# Patient Record
Sex: Female | Born: 1990 | Race: Black or African American | Hispanic: No | Marital: Single | Smoking: Never smoker
Health system: Southern US, Community
[De-identification: ages and names within clinical notes are randomized; demographics above are authoritative.]

## PROBLEM LIST (undated history)

## (undated) ENCOUNTER — Emergency Department (HOSPITAL_COMMUNITY): Discharge status: Absent without leave | Payer: PRIVATE HEALTH INSURANCE

## (undated) DIAGNOSIS — J40 Bronchitis, not specified as acute or chronic: Secondary | ICD-10-CM

## (undated) DIAGNOSIS — E282 Polycystic ovarian syndrome: Secondary | ICD-10-CM

## (undated) HISTORY — DX: Polycystic ovarian syndrome: E28.2

## (undated) HISTORY — PX: WISDOM TOOTH EXTRACTION: SHX21

---
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
## 2008-08-19 ENCOUNTER — Telehealth (INDEPENDENT_AMBULATORY_CARE_PROVIDER_SITE_OTHER): Payer: Self-pay | Admitting: Family Medicine

## 2008-08-19 ENCOUNTER — Ambulatory Visit: Payer: Self-pay | Admitting: Family Medicine

## 2008-09-07 ENCOUNTER — Telehealth: Payer: Self-pay | Admitting: *Deleted

## 2009-01-20 ENCOUNTER — Ambulatory Visit: Payer: Self-pay | Admitting: Family Medicine

## 2009-07-06 ENCOUNTER — Ambulatory Visit: Payer: Self-pay | Admitting: Family Medicine

## 2009-07-06 ENCOUNTER — Encounter: Payer: Self-pay | Admitting: Family Medicine

## 2009-07-06 ENCOUNTER — Other Ambulatory Visit: Admission: RE | Admit: 2009-07-06 | Discharge: 2009-07-06 | Payer: Self-pay | Admitting: Family Medicine

## 2009-07-07 ENCOUNTER — Encounter: Payer: Self-pay | Admitting: Family Medicine

## 2009-07-13 ENCOUNTER — Encounter: Payer: Self-pay | Admitting: Family Medicine

## 2010-01-17 ENCOUNTER — Ambulatory Visit: Payer: Self-pay | Admitting: Family Medicine

## 2010-01-17 ENCOUNTER — Encounter: Payer: Self-pay | Admitting: Family Medicine

## 2010-01-17 DIAGNOSIS — E663 Overweight: Secondary | ICD-10-CM | POA: Insufficient documentation

## 2010-01-17 DIAGNOSIS — L2089 Other atopic dermatitis: Secondary | ICD-10-CM | POA: Insufficient documentation

## 2010-01-17 LAB — CONVERTED CEMR LAB
Glucose, Bld: 86 mg/dL (ref 70–99)
Prolactin: 8 ng/mL
TSH: 1.383 microintl units/mL (ref 0.350–4.500)
Testosterone: 34.25 ng/dL (ref 10–70)

## 2010-01-21 ENCOUNTER — Ambulatory Visit (HOSPITAL_COMMUNITY): Admission: RE | Admit: 2010-01-21 | Discharge: 2010-01-21 | Payer: Self-pay | Admitting: Family Medicine

## 2010-01-24 ENCOUNTER — Ambulatory Visit: Payer: Self-pay | Admitting: Family Medicine

## 2010-03-01 ENCOUNTER — Ambulatory Visit: Payer: Self-pay | Admitting: Family Medicine

## 2010-03-01 DIAGNOSIS — F609 Personality disorder, unspecified: Secondary | ICD-10-CM | POA: Insufficient documentation

## 2010-06-17 ENCOUNTER — Encounter: Payer: Self-pay | Admitting: Family Medicine

## 2010-07-27 ENCOUNTER — Ambulatory Visit: Payer: Self-pay

## 2010-08-03 ENCOUNTER — Emergency Department (HOSPITAL_COMMUNITY)
Admission: EM | Admit: 2010-08-03 | Discharge: 2010-08-03 | Payer: Self-pay | Source: Home / Self Care | Admitting: Emergency Medicine

## 2010-08-19 ENCOUNTER — Ambulatory Visit: Admit: 2010-08-19 | Payer: Self-pay

## 2010-09-13 NOTE — Miscellaneous (Signed)
  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

## 2010-09-13 NOTE — Assessment & Plan Note (Signed)
Summary: f/up,tcb   Vital Signs:  Patient profile:   20 year old female Height:      66 inches Weight:      159.7 pounds BMI:     25.87 Temp:     98.4 degrees F oral Pulse rate:   74 / minute BP sitting:   117 / 75  (left arm) Cuff size:   regular  Vitals Entered By: Garen Grams LPN (January 24, 2010 3:16 PM) CC: f/u weight, nutrition Is Patient Diabetic? No Pain Assessment Patient in pain? no        CC:  f/u weight and nutrition.  History of Present Illness: 1. Nutrition visit:    Food log highlights: - no meals before 3:30 pm -heavy carbs -light on proteins -no good fats -long time between meals  Counseling:  Spent 20 minutes going over food log and educating patient -explaining the importance of balanced macronutrients -how her diet is affecting her hormones -Gave her a list of good weight loss foods  Habits & Providers  Alcohol-Tobacco-Diet     Tobacco Status: never  Current Medications (verified): 1)  Sprintec 28 0.25-35 Mg-Mcg Tabs (Norgestimate-Eth Estradiol) .... One Daily 2)  Triamcinolone Acetonide 0.1 % Oint (Triamcinolone Acetonide) .... Apply Small Amount To Dry Skin As Needed Dispo: Qs For 1 Month  Allergies: No Known Drug Allergies  Past History:  Past Medical History: Hypertrichosis - Seasonal allergies Seizure disorder - 20 y/o Was on Depakote x 1 1/2 y Acne Hirsutism  Social History: Reviewed history from 08/19/2008 and no changes required. High school senior; going to [employer].   Lives with great grandmother (guardian), plays tennis, track and field.  Has been sexually activewith fiancee (started at age 80).  Doesn't use condoms regularly.  No tobacco, no illicits, no ETOH.  Physical Exam  General:  Well-developed,well-nourished,in no acute distress; alert,appropriate and cooperative throughout examination Neck:  supple and no masses.   Lungs:  normal respiratory effort, normal breath sounds, no crackles, and no wheezes.   Heart:   normal rate, regular rhythm, and no murmur.   Skin:  Significant hirsutism Small keloids on piercing sites Psych:  good eye contact, not anxious appearing, and not depressed appearing.     Impression & Recommendations:  Problem # 1:  OVERWEIGHT (ICD-278.02) Assessment Unchanged  spent  ~ 30 minutes in counseling patient on appropriate dietary changes.  Will f/u in 1 month to recheck weight and food log and see what changes she was able to make.  Once diet is improved would consider looking at lower estrogen OCPs to see if that would help.  Orders: [facility]- Est Level  3 (16109)  Complete Medication List: 1)  Sprintec 28 0.25-35 Mg-mcg Tabs (Norgestimate-eth estradiol) .... One daily 2)  Triamcinolone Acetonide 0.1 % Oint (Triamcinolone acetonide) .... Apply small amount to dry skin as needed dispo: qs for 1 month  Patient Instructions: 1)  It was good to see you today 2)  I think that we can make a big difference in how you feel if we adjust your diet 3)  Here is a good list of foods to choose from 4)  Please schedule a follow up appointment with me in 1 month. 5)  Keep another 2-3 day food log before that visit

## 2010-09-13 NOTE — Assessment & Plan Note (Signed)
Summary: f/u,df   Vital Signs:  Patient profile:   20 year old female Height:      66 inches Weight:      157.2 pounds BMI:     25.46 Temp:     98.3 degrees F oral Pulse rate:   67 / minute BP sitting:   119 / 79  (left arm) Cuff size:   regular  Vitals Entered By: Garen Grams LPN (March 01, 2010 9:09 AM) CC: f/u weight, nutrition Is Patient Diabetic? No Pain Assessment Patient in pain? no        CC:  f/u weight and nutrition.  History of Present Illness: 1. weight loss: She is here to work on her weight loss.  She was given a list of healthy foods to follow at her last visit which she tried to follow but had a tough time doing it.  She is having problems with cravings.  She stuck to the diet up until July 3rd and then hasn't been doing well since then.  Hasn't gained any weight since last visit and has actually lost a couple of pounds.    Food log: improved.  Is now eating breakfast and has decreased the amount of carbs.  Still needs to cut out more carbs.  2. Ear piercing infection: - Right ear upper lobe piercing - Has had it for months - It is painful to push on - She hasn't removed the piercing at all  ROS: denies ear redness, warmth, or fevers  3. Thought and Personality disorder - Thinks that she may have some similar traits as her mom who was diagnosed with bipolar and sociopath. 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 One Daily 2)  Triamcinolone Acetonide 0.1 % Oint (Triamcinolone Acetonide) .... Apply Small Amount To Dry Skin As Needed Dispo: Qs For 1  Month  Allergies: No Known Drug Allergies  Past History:  Past Medical History: Reviewed history from 01/24/2010 and no changes required. Hypertrichosis - Seasonal allergies Seizure disorder - 20 y/o Was on Depakote x 1 1/2 y Acne Hirsutism  Social History: Reviewed history from 08/19/2008 and no changes required. High school senior; going to [employer].   Lives with great grandmother (guardian), plays tennis, track and field.  Has been sexually activewith fiancee (started at age 24).  Doesn't use condoms regularly.  No tobacco, no illicits, no ETOH.  Physical Exam  General:  Well-developed,well-nourished,in no acute distress; alert,appropriate and cooperative throughout examination Ears:  Right ear:  TTP around piercing in upper lobe.  + pus expression.  Not warm or red.  + keloids Neck:  no masses Lungs:  normal respiratory effort.   Heart:  normal rate and regular rhythm.   Abdomen:  soft, non-tender, and normal bowel sounds.   Extremities:  no lower extremity edema Neurologic:  alert & oriented X3, cranial nerves II-XII intact, and strength normal in all extremities.   Psych:  Oriented X3, normally interactive, good eye contact, not anxious appearing, not depressed appearing, and not agitated.     Impression & Recommendations:  Problem # 1:  DIETARY SURVEILLANCE AND COUNSELING (ICD-V65.3)  Assessment New  Spent 20 minutes discussing with patient diet and nutrition advice.  Encouraged her to continue to limit the amount of carbs.  Eat every 3-4 hours.  Orders: [facility]- Est  Level 4 (78295)  Problem # 2:  INFECTION, SKIN AND SOFT TISSUE (ICD-686.9) Assessment: New  Infected ear piercing.  Advised pt to remove ear piercing.  She didn't want to do this.  Will try Antibiotic oinment on site 4 times / day for the next week.  If not better by then would strongly encourage her to remove piercing.  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  She has trouble relating to people and being in social situations.  Offered mental health counseling which she refused.  Orders: [facility]- Est  Level 4 (99214)  Complete Medication List: 1)  Sprintec 28 0.25-35 Mg-mcg Tabs (Norgestimate-eth estradiol) .... One daily 2)  Triamcinolone Acetonide 0.1 % Oint (Triamcinolone acetonide) .... Apply small amount to dry skin as needed dispo: qs for 1 month  Patient Instructions: 1)  It was good to see you today 2)  I'm glad that you have lost some weight and I think that your skin looks clearer 3)  Try and stick with the list of foods for all but one meal a week. 4)  Once a week you can have a cheat meal where you eat whatever you want. 5)  Put neosporin on the ear piercing 3-4 times / day 6)  If it is not better in a couple of weeks, you will need to take the piercing out 7)  For your thought disorder, we have good people that you can talk to, just let me know when you feel like you need to  8)  Please schedule a follow up appointment with me in 3 months

## 2010-09-13 NOTE — Assessment & Plan Note (Signed)
Summary: Multiple issues   Vital Signs:  Patient profile:   20 year old female Height:      66 inches Weight:      160 pounds BMI:     25.92 BSA:     1.82 Temp:     97.9 degrees F Pulse rate:   89 / minute BP sitting:   121 / 79  Vitals Entered By: Jone Baseman CMA (January 17, 2010 2:44 PM) CC: skin issues, joint pain, overweight Is Patient Diabetic? No Pain Assessment Patient in pain? no        CC:  skin issues, joint pain, and overweight.  History of Present Illness: 1. Dry skin:  has had a couple dry patches on her skin.  Uses moisturizers and it gets better  2. Keloids:  Has a new piercing in her right ear.  It is forming small keloids at the piercing sites.  She normally doesn't form keloids.  She doesn't like the way that they look.  3. Hirsutism: Has had excessive facial hair ever since puberty.  She thinks that it is related to a medicine that she took when she was younger.  Has never been checked for PCOS  4. Joint pain: Her left shoulder and left hip sometimes bother her.  She thinks that it is from lifting weight or from sleeping on them funny.  5. Overweight:  Would like to lose some weight.  Habits & Providers  Alcohol-Tobacco-Diet     Tobacco Status: never  Current Medications (verified): 1)  Sprintec 28 0.25-35 Mg-Mcg Tabs (Norgestimate-Eth Estradiol) .... One Daily  Allergies: No Known Drug Allergies  Past History:  Past Medical History: Reviewed history from 01/11/2007 and no changes required. Hypertrichosis - Seasonal allergies Seizure disorder - 20 y/o Was on Depakote x 1 1/2 y Acne  Social History: Reviewed history from 08/19/2008 and no changes required. High school senior; going to [employer].   Lives with great grandmother (guardian), plays tennis, track and field.  Has been sexually activewith fiancee (started at age 22).  Doesn't use condoms regularly.  No tobacco, no illicits, no ETOH.  Review of Systems  The patient denies fever,  weight loss, chest pain, syncope, dyspnea on exertion, peripheral edema, headaches, abdominal pain, muscle weakness, and suspicious skin lesions.    Physical Exam  General:  Well-developed,well-nourished,in no acute distress; alert,appropriate and cooperative throughout examination Lungs:  normal respiratory effort, normal breath sounds, no crackles, and no wheezes.   Heart:  normal rate, regular rhythm, and no murmur.   Msk:  Left shoulder: normal ROM, slightly TTP across joint lines  Extremities:  no lower extremity edema Skin:  One small dry patch on her back. 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  If unable to get in, would do Kenolog injection Orders: Dermatology Referral (Derma) [facility]- Est  Level 4 (87564)  Problem # 2:  ECZEMA, ATOPIC (ICD-691.8) Assessment: New  Triamcinolone cream Orders: Dermatology Referral (Derma) Ultrasound (Ultrasound) [facility]- Est  Level 4 (33295)  Her updated medication list for this problem includes:    Triamcinolone Acetonide 0.1 % Oint (Triamcinolone acetonide) .Marland Kitchen... Apply small amount to dry skin as needed dispo: qs for 1 month  Problem # 3:  HIRSUTISM (ICD-704.1) Assessment: New Will work up for PCOS.  would benefit from dietary adjustments Orders: Dermatology Referral (Derma) TSH-[facility] [phone]) Testosterone-[facility] [phone]) Prolactin-[facility] [phone]) Glucose-[facility] [phone]) [facility]- Est  Level 4 (99214)  Problem # 4:  OVERWEIGHT (ICD-278.02) Assessment: New  Will spend next appointment designated on diet / exercise.  Told her to bring in a 2-3 day food diary.  Orders: [facility]- Est  Level 4 (99214)  Complete Medication List: 1)  Sprintec 28 0.25-35 Mg-mcg Tabs (Norgestimate-eth estradiol) .... One daily 2)  Triamcinolone Acetonide 0.1 % Oint (Triamcinolone acetonide) .... Apply small amount to dry skin as needed  dispo: qs for 1 month  Patient Instructions: 1)  It was nice to meet you today 2)  I have put in a referral for Dermatology for your keloids and hair growth 3)  If you are unable to get in and see them, we can treat you for those issues 4)  I have sent in a steroid cream to use if you have dry patches 5)  I am going to do some testing to check you for PCOS 6)  I think that you should start taking Glucosamine / Chondroitin and Fish Oil for your joints 7)  Please schedule an appointment that is convenient for you to discuss your weight loss goals.  When you do please bring with you a 2-3 day food diary. Prescriptions: TRIAMCINOLONE ACETONIDE 0.1 % OINT (TRIAMCINOLONE ACETONIDE) Apply small amount to dry skin as needed Dispo: QS for 1 month  #1 x 3   Entered and Authorized by:   Angelena Sole MD   Signed by:   Angelena Sole MD on 01/17/2010   Method used:   Electronically to        [facility] [phone]* (retail)       [address]       [place], [place]  [address]       Ph: [phone] or [phone]       Fax: [phone]   RxID:   [id number]

## 2010-11-08 ENCOUNTER — Ambulatory Visit (INDEPENDENT_AMBULATORY_CARE_PROVIDER_SITE_OTHER): Payer: Medicaid Other | Admitting: Family Medicine

## 2010-11-08 ENCOUNTER — Encounter: Payer: Self-pay | Admitting: Family Medicine

## 2010-11-08 VITALS — BP 101/68 | HR 69 | Temp 98.6°F | Ht 66.0 in | Wt 151.6 lb

## 2010-11-08 DIAGNOSIS — E663 Overweight: Secondary | ICD-10-CM

## 2010-11-08 DIAGNOSIS — J45990 Exercise induced bronchospasm: Secondary | ICD-10-CM | POA: Insufficient documentation

## 2010-11-08 MED ORDER — ALBUTEROL SULFATE HFA 108 (90 BASE) MCG/ACT IN AERS: 2.0000 | INHALATION_SPRAY | Freq: Four times a day (QID) | RESPIRATORY_TRACT | Status: DC | PRN

## 2010-11-08 NOTE — Assessment & Plan Note (Signed)
Almost at ideal weight.  Congratulated her on her recent weight loss success.  Her biggest struggle is dealing with the cravings.  Advised her to start incorporating a cheat meal into her plan.  F/U in 3 months

## 2010-11-08 NOTE — Assessment & Plan Note (Signed)
Pt describes wheezing after exercise.  Will do a trial of Albuterol and see if that helps.

## 2010-11-08 NOTE — Progress Notes (Signed)
  Subjective:    Patient ID: Wendy Parsons, female    DOB: [birth year], 20 y.o.   MRN: [id number]  HPI  Accompanied by her boyfriend Durene Cal) 1. Wheezing:  Pt presents today to discuss wheezing.  She has had this for years but it is worse in the spring with her allergies.  She only gets the shortness of breath and wheezing after she has been exercising.  She is also going to be doing a Taekwondo class next semester and would like something to keep her from being short of breath during that.  She doesn't think that it is only being out of shape.  She doesn't have wheezing at other times.  She has never tried anything for it.  Rest makes it better.  2. Overweight:  She is nearly to her ideal weight.  She has been sticking to the diet off and on for the past couple of months.  She was really good for about 1 month and lost a lot of weight.  Her goal weight is around 145.  Her boyfriend has also started on the plan and has lost about 10 lbs.  Her biggest struggle is dealing with the cravings.   Review of Systems Denies chest pain, abdominal pain, acid taste in her mouth, snoring, fevers, cough, pleuritic pain    Objective:   Physical Exam  Constitutional: She is oriented to person, place, and time. She appears well-nourished. No distress.  Eyes: Conjunctivae are normal.  Neck: Normal range of motion. Neck supple. No thyromegaly present.  Cardiovascular: Normal rate, regular rhythm and normal heart sounds.   No murmur heard. Pulmonary/Chest: Effort normal and breath sounds normal. No respiratory distress. She has no wheezes. She has no rales.  Abdominal: Soft. She exhibits no distension.  Musculoskeletal: She exhibits no edema.  Neurological: She is alert and oriented to person, place, and time.  Skin: Skin is warm and dry. No rash noted. She is not diaphoretic.  Psychiatric: She has a normal mood and affect.          Assessment & Plan:

## 2010-11-08 NOTE — Patient Instructions (Signed)
Congratulations on your weight loss, you are almost at your goal weight Try and incorporate a cheat meal once a week into your routine, this should help with the cravings We will try an inhaler for the wheezing and see if that helps Please schedule a follow up appointment in 3 months

## 2010-11-30 ENCOUNTER — Ambulatory Visit (INDEPENDENT_AMBULATORY_CARE_PROVIDER_SITE_OTHER): Payer: Medicaid Other | Admitting: Family Medicine

## 2010-11-30 ENCOUNTER — Encounter: Payer: Self-pay | Admitting: Family Medicine

## 2010-11-30 DIAGNOSIS — F439 Reaction to severe stress, unspecified: Secondary | ICD-10-CM | POA: Insufficient documentation

## 2010-11-30 DIAGNOSIS — G44009 Cluster headache syndrome, unspecified, not intractable: Secondary | ICD-10-CM | POA: Insufficient documentation

## 2010-11-30 DIAGNOSIS — F430 Acute stress reaction: Secondary | ICD-10-CM

## 2010-11-30 NOTE — Progress Notes (Signed)
  Subjective:    Patient ID: Wendy Parsons, female    DOB: [birth year], 20 y.o.   MRN: [id number]  HPI 1. Stress:  Pt is under a lot of stress for the past couple of weeks.  She has a heavy work load from school and is in the middle of finals.  She has been helping to relieve the stress by going out on the weekends and drinking 1-2 beers with friends.  She has not been doing any illegal or prescription drugs.    2. Headaches:  Since she has been under a lot of stress she has had headaches.  She has a hx of headaches but these are a little different.  She normally has bilateral, squeezing type headaches.  These headaches are located on the left side of her head above her left eye with some radiation down around her left ear.  They normally last for about 1 hour.  They go away on their own with rest and relaxation.  She hasn't been taking anything for the pain not even Tylenol / Ibuprofen.  She has had some photophobia but denies any phonophobia or n/v.  The headaches come and go.  She has been having them about every other day.  She has not been getting enough sleep and has not been eating very healthy.   Review of Systems Denies fever, chills, neck pain / stiffness, numbness / weakness, vision changes, hearing changes    Objective:   Physical Exam  Constitutional: She is oriented to person, place, and time. She appears well-nourished. No distress.  Eyes: Conjunctivae are normal.  Fundoscopic exam:      The right eye shows no arteriolar narrowing, no AV nicking and no papilledema.       The left eye shows no arteriolar narrowing, no AV nicking and no papilledema.  Neck: Normal range of motion. Neck supple. No thyromegaly present.  Cardiovascular: Normal rate, regular rhythm and normal heart sounds.   No murmur heard. Pulmonary/Chest: Effort normal and breath sounds normal. No respiratory distress. She has no wheezes. She has no rales.  Abdominal: Soft. She exhibits no distension.    Musculoskeletal: She exhibits no edema.  Neurological: She is alert and oriented to person, place, and time. She has normal strength. No cranial nerve deficit or sensory deficit. She exhibits normal muscle tone. She displays a negative Romberg sign. Gait normal.  Skin: Skin is warm and dry. No rash noted. She is not diaphoretic.  Psychiatric: She has a normal mood and affect.          Assessment & Plan:

## 2010-11-30 NOTE — Assessment & Plan Note (Signed)
Likely multifactorial from school, lack of sleep, poor diet.  Reviewed appropriate lifestyle changes that can help.  Will also recommend basic analgesic treatment with Tylenol / Ibuprofen.  If persists would consider something for ppx.

## 2010-11-30 NOTE — Assessment & Plan Note (Signed)
From responsibilities at school.  No indication for medical therapy.  Reviewed some basic stress management skills.

## 2011-08-13 ENCOUNTER — Encounter (HOSPITAL_COMMUNITY): Payer: Self-pay

## 2011-08-13 ENCOUNTER — Emergency Department (INDEPENDENT_AMBULATORY_CARE_PROVIDER_SITE_OTHER)
Admission: EM | Admit: 2011-08-13 | Discharge: 2011-08-13 | Disposition: A | Discharge status: Home / Self Care | Payer: Medicaid Other | Source: Home / Self Care | Attending: Emergency Medicine | Admitting: Emergency Medicine

## 2011-08-13 DIAGNOSIS — B9789 Other viral agents as the cause of diseases classified elsewhere: Secondary | ICD-10-CM

## 2011-08-13 DIAGNOSIS — J029 Acute pharyngitis, unspecified: Secondary | ICD-10-CM

## 2011-08-13 HISTORY — DX: Bronchitis, not specified as acute or chronic: J40

## 2011-08-13 MED ORDER — GUAIFENESIN ER 600 MG PO TB12: 600.0000 mg | ORAL_TABLET | Freq: Two times a day (BID) | ORAL | Status: AC

## 2011-08-13 MED ORDER — LIDOCAINE VISCOUS 2 % MT SOLN: 10.0000 mL | Freq: Three times a day (TID) | OROMUCOSAL | Status: AC | PRN

## 2011-08-13 MED ORDER — IBUPROFEN 600 MG PO TABS: 600.0000 mg | ORAL_TABLET | Freq: Four times a day (QID) | ORAL | Status: AC | PRN

## 2011-08-13 NOTE — ED Provider Notes (Signed)
History     CSN: [id number]  Arrival date & time 08/13/11  0908   First MD Initiated Contact with Patient 08/13/11 [phone]      Chief Complaint  Patient presents with  . Sore Throat    HPI Comments: Pt with ST, bodyaches, chills, swollen cervical LN, fatigue starting yesterday. Reports some chest and nasal congestion, raspy voice.  No documented fevers at home. No N/V, cough, wheeze, SOB, abd pain, rash, sensation of throat swelling shut, sick contacts. No Breathing difficulty, Drooling, Trismus. Took some mucinex last night x 1 w/o relief.      Patient is a 20 y.o. female presenting with pharyngitis. The history is provided by the patient.  Sore Throat This is a new problem. The current episode started yesterday. The problem occurs constantly. The problem has been gradually worsening. Pertinent negatives include no abdominal pain. The symptoms are aggravated by swallowing.    Past Medical History  Diagnosis Date  . Bronchitis     Past Surgical History  Procedure Date  . Wisdom tooth extraction     History reviewed. No pertinent family history.  History  Substance Use Topics  . Smoking status: Never Smoker   . Smokeless tobacco: Not on file  . Alcohol Use: Yes    OB History    Grav Para Term Preterm Abortions TAB SAB Ect Mult Living                  Review of Systems  Constitutional: Positive for fatigue. Negative for fever.  HENT: Positive for congestion, sore throat and trouble swallowing. Negative for ear pain, rhinorrhea, mouth sores, voice change and postnasal drip.   Respiratory: Negative for cough and wheezing.   Gastrointestinal: Negative for nausea, vomiting and abdominal pain.  Musculoskeletal: Positive for myalgias.  Skin: Negative for rash.  Hematological: Positive for adenopathy.  All other systems reviewed and are negative.    Allergies  Latex  Home Medications   Current Outpatient Rx  Name Route Sig Dispense Refill  . ALBUTEROL SULFATE  HFA 108 (90 BASE) MCG/ACT IN AERS Inhalation Inhale 2 puffs into the lungs every 6 (six) hours as needed for wheezing. 1 Inhaler 3  . GUAIFENESIN ER 600 MG PO TB12 Oral Take 1 tablet (600 mg total) by mouth 2 (two) times daily. 14 tablet 0  . IBUPROFEN 600 MG PO TABS Oral Take 1 tablet (600 mg total) by mouth every 6 (six) hours as needed for pain. 30 tablet 0  . LIDOCAINE VISCOUS 2 % MT SOLN Oral Take 10 mLs by mouth 3 (three) times daily as needed for pain. Swish and spit. Do not swallow. 100 mL 0  . NORGESTIMATE-ETH ESTRADIOL 0.25-35 MG-MCG PO TABS Oral Take 1 tablet by mouth daily.      . TRIAMCINOLONE ACETONIDE 0.1 % EX OINT  Apply small amount to dry skin as needed       BP 142/87  Pulse 69  Temp(Src) 98.1 F (36.7 C) (Oral)  Resp 14  SpO2 100%  LMP 08/10/2011  Physical Exam  Nursing note and vitals reviewed. Constitutional: She is oriented to person, place, and time. She appears well-developed and well-nourished. No distress.  HENT:  Head: Normocephalic and atraumatic. No trismus in the jaw.  Right Ear: Tympanic membrane normal.  Left Ear: Tympanic membrane normal.  Nose: Rhinorrhea present. No mucosal edema. Right sinus exhibits no maxillary sinus tenderness and no frontal sinus tenderness. Left sinus exhibits no maxillary sinus tenderness and no frontal sinus  tenderness.  Mouth/Throat: Uvula is midline and mucous membranes are normal. Posterior oropharyngeal erythema present. No oropharyngeal exudate or tonsillar abscesses.       Swollen red, tonsils with scant exudates  Eyes: Conjunctivae and EOM are normal. Pupils are equal, round, and reactive to light.  Neck: Normal range of motion.  Cardiovascular: Normal rate, regular rhythm and normal heart sounds.   Pulmonary/Chest: Effort normal and breath sounds normal. No stridor.  Abdominal: Soft. Bowel sounds are normal. She exhibits no distension and no mass. There is no tenderness. There is no rebound and no guarding.    Musculoskeletal: Normal range of motion.  Lymphadenopathy:    She has cervical adenopathy.  Neurological: She is alert and oriented to person, place, and time.  Skin: Skin is warm and dry.  Psychiatric: She has a normal mood and affect. Her behavior is normal. Judgment and thought content normal.    ED Course  Procedures (including critical care time)   Labs Reviewed  POCT RAPID STREP A ([facility]RG CARE ONLY)   No results found.   1. Pharyngitis with viral syndrome       MDM    Luiz Blare, MD 08/13/11 1023

## 2011-08-13 NOTE — ED Notes (Signed)
Pt dry throat and cold symptoms that started yesterday

## 2011-08-17 ENCOUNTER — Emergency Department (INDEPENDENT_AMBULATORY_CARE_PROVIDER_SITE_OTHER)
Admission: EM | Admit: 2011-08-17 | Discharge: 2011-08-17 | Disposition: A | Discharge status: Home / Self Care | Payer: PRIVATE HEALTH INSURANCE | Source: Home / Self Care | Attending: Family Medicine | Admitting: Family Medicine

## 2011-08-17 ENCOUNTER — Encounter (HOSPITAL_COMMUNITY): Payer: Self-pay

## 2011-08-17 DIAGNOSIS — J029 Acute pharyngitis, unspecified: Secondary | ICD-10-CM

## 2011-08-17 MED ORDER — ACETAMINOPHEN-CODEINE 120-12 MG/5ML PO SUSP: 5.0000 mL | Freq: Four times a day (QID) | ORAL | Status: AC | PRN

## 2011-08-17 MED ORDER — PREDNISONE 20 MG PO TABS: ORAL_TABLET | ORAL | Status: AC

## 2011-08-17 MED ORDER — CEFPODOXIME PROXETIL 100 MG PO TABS: 100.0000 mg | ORAL_TABLET | Freq: Two times a day (BID) | ORAL | Status: DC

## 2011-08-17 MED ORDER — CEFPODOXIME PROXETIL 100 MG PO TABS: 100.0000 mg | ORAL_TABLET | Freq: Two times a day (BID) | ORAL | Status: AC

## 2011-08-17 NOTE — ED Notes (Signed)
.    States she was seen here Sunday for sore throat and cold sx.  States sore throat is much worse since last night.  States she has mostly been in bed since Sunday.

## 2011-08-21 NOTE — ED Provider Notes (Signed)
History     CSN: [id number]  Arrival date & time 08/17/11  4098   First MD Initiated Contact with Patient 08/17/11 1821      Chief Complaint  Patient presents with  . Sore Throat    (Consider location/radiation/quality/duration/timing/severity/associated sxs/prior treatment) HPI Comments: 54 y/ female non smoker here c/o sore throat for 5 days worsening was seen here 3 days ago treated as viral infection but symptoms worsening. Reports subjective fever and chills, pain with swallowing and body aches. No difficulty breathing or chest pain. Rapid strep test negative x 2. Mono test negative. Close contacts with influenza like illness. No nausea, vomiting or diarrhea.   Past Medical History  Diagnosis Date  . Bronchitis     Past Surgical History  Procedure Date  . Wisdom tooth extraction     No family history on file.  History  Substance Use Topics  . Smoking status: Never Smoker   . Smokeless tobacco: Not on file  . Alcohol Use: Yes    OB History    Grav Para Term Preterm Abortions TAB SAB Ect Mult Living                  Review of Systems  Constitutional: Positive for fever and chills.  HENT: Positive for sore throat. Negative for drooling, neck stiffness and voice change.   Respiratory: Positive for cough. Negative for chest tightness, shortness of breath, wheezing and stridor.   Gastrointestinal: Negative for nausea, vomiting, abdominal pain and diarrhea.  Musculoskeletal: Positive for myalgias.  Skin: Negative for rash.  Neurological: Positive for headaches.    Allergies  Latex  Home Medications   Current Outpatient Rx  Name Route Sig Dispense Refill  . GUAIFENESIN ER 600 MG PO TB12 Oral Take 1 tablet (600 mg total) by mouth 2 (two) times daily. 14 tablet 0  . IBUPROFEN 600 MG PO TABS Oral Take 1 tablet (600 mg total) by mouth every 6 (six) hours as needed for pain. 30 tablet 0  . LIDOCAINE VISCOUS 2 % MT SOLN Oral Take 10 mLs by mouth 3 (three) times  daily as needed for pain. Swish and spit. Do not swallow. 100 mL 0  . NORGESTIMATE-ETH ESTRADIOL 0.25-35 MG-MCG PO TABS Oral Take 1 tablet by mouth daily.      . ACETAMINOPHEN-CODEINE 120-12 MG/5ML PO SUSP Oral Take 5 mLs by mouth every 6 (six) hours as needed for pain.  60 mL 0  . CEFPODOXIME PROXETIL 100 MG PO TABS Oral Take 1 tablet (100 mg total) by mouth 2 (two) times daily. 14 tablet 0  . PREDNISONE 20 MG PO TABS  2 tabs po daily for 5 days. 10 tablet no    BP 97/73  Pulse 89  Temp(Src) 98.4 F (36.9 C) (Oral)  Resp 18  SpO2 100%  LMP 08/10/2011  Physical Exam  Nursing note and vitals reviewed. Constitutional: She is oriented to person, place, and time. She appears well-developed and well-nourished. No distress.  HENT:  Head: Normocephalic and atraumatic.  Right Ear: External ear normal.  Left Ear: External ear normal.  Nose: Nose normal.       Pharyngeal and bilateral tonsil erythema and swelling, no exudates, uvula central with symmetric elevation of soft palate. No trismus. No peritonsillar swelling, no fluctuations.  Eyes: Conjunctivae and EOM are normal. Pupils are equal, round, and reactive to light.  Neck: Normal range of motion. Neck supple.       Moderate enlarged bilateral tender submandibular lymph nodes.  Cardiovascular: Normal rate, regular rhythm and normal heart sounds.   Pulmonary/Chest: Effort normal and breath sounds normal. No respiratory distress. She has no wheezes. She has no rales. She exhibits no tenderness.  Abdominal: Soft. There is no tenderness.       No hepato or splenomegally  Neurological: She is alert and oriented to person, place, and time.  Skin: No rash noted.    ED Course  Procedures (including critical care time)   Labs Reviewed  POCT RAPID STREP A ([facility] CARE ONLY)  POCT INFECTIOUS MONO SCREEN  LAB REPORT - SCANNED   No results found.   1. Pharyngitis       MDM  Associated tonsillitis. Non toxic. Negative rapid strep  test as well as mono and not enlarged splen but early in evolution. Decided to treat with vantin, prednisone and analgesics. Ask to avoid exertion and return if worsening symptoms.        Sharin Grave, MD 08/21/11 [phone]

## 2014-03-20 ENCOUNTER — Encounter: Payer: Self-pay | Admitting: *Deleted

## 2014-05-13 ENCOUNTER — Encounter: Payer: PRIVATE HEALTH INSURANCE | Admitting: Obstetrics & Gynecology

## 2014-05-15 ENCOUNTER — Ambulatory Visit (INDEPENDENT_AMBULATORY_CARE_PROVIDER_SITE_OTHER): Payer: Self-pay | Admitting: Obstetrics & Gynecology

## 2014-05-15 ENCOUNTER — Encounter: Payer: Self-pay | Admitting: Obstetrics & Gynecology

## 2014-05-15 VITALS — BP 118/75 | HR 55 | Ht 65.0 in | Wt 125.0 lb

## 2014-05-15 DIAGNOSIS — L680 Hirsutism: Secondary | ICD-10-CM

## 2014-05-15 LAB — HEMOGLOBIN A1C
HEMOGLOBIN A1C: 5.6 % (ref <5.7)
MEAN PLASMA GLUCOSE: 114 mg/dL (ref <117)

## 2014-05-15 LAB — TSH: TSH: 2.595 u[IU]/mL (ref 0.350–4.500)

## 2014-05-15 MED ORDER — NORETHIN ACE-ETH ESTRAD-FE 1-20 MG-MCG(24) PO TABS: 1.0000 | ORAL_TABLET | Freq: Every day | ORAL | Status: DC

## 2014-05-15 MED ORDER — SPIRONOLACTONE 100 MG PO TABS: 200.0000 mg | ORAL_TABLET | Freq: Once | ORAL | Status: DC

## 2014-05-15 NOTE — Patient Instructions (Addendum)
Hirsutism and Masculinization Hirsutism (increased body hair) is the growth of colored (pigmented) thick hair in women. It is most noticeable when it is on the moustache or beard areas. The other common sites are the:  Chest.  Abdomen.  Thighs.  Back. Pubic hair growth may run upward from the usual bikini line to the middle of the abdomen.  Virilism (masculinization) is more extensive than hirsutism. It has extra symptoms. There may be:  Acne.  Oily skin.  Baldness.  Enlargement of the clitoris.  Increased sex drive (libido).  Voice deepening.  Reduced breast size.  Irregular or absent periods.  Aggression. The scalp hair growth may also bald in a typical female pattern. CAUSES  This is caused by too much female sex hormone (androgen) in the body. It can be produced by the ovaries, adrenal glands, and within the skin. Hirsutism is most commonly related to polycystic ovarian syndrome (PCOS). The first signs of increased androgen levels are hirsutism and acne. How sensitive each person is to hormone levels varies greatly. Virilism may result from higher androgen levels. Some women with hirsutism have normal hormone levels. Eventually there may be female pattern balding. These problems are also connected to difficulty in having children (infertility). In addition, both malignant and benign tumors may cause hirsutism such as tumors of the adrenal gland (adenomas or adenocarcinomas) but this is a rare cause. There is also evidence that insulin resistance may cause the androgynism. This problem is treated with some success with a medicine for diabetes (metformin). Causes that come from outside the body (exogenous) may also lead to hirsutism such as intake of androgens by mouth.  Note: Women of [place], [place], and [place] heritage commonly have facial, abdominal, and thigh hair that is normal for them.  TREATMENT  There are medical treatments that inhibit these  conditions, such as:  Combined oral contraceptive pills, if you are not trying to become pregnant.  Medicines that stop the production of hormones (gonadotropins).  Steroids. This may be used if there is evidence of congenital (present since birth) adrenal hyperplasia (abnormal growth of cells).  Metformin for the treatment of virilization, if sensitive to insulin.  Suppression of ovarian hormone production with GnRH analogues (a hormone). They can only be used by themselves for short periods of time. There are a variety of cosmetic treatments. These may be all that you need. They may be effective against occasional problems.  Shaving is the simplest and most effective in the short term. Bleaching is not usually suitable for severe hirsutism.  Plucking, waxing, sugaring (similar to waxing), and depilatory creams are effective. However, on occasion, they can result in skin irritation (inflammation) or infection.  Electrolysis is effective. Your caregiver can help you decide what needs to be done and what course of treatment will be best for you. Your caregiver may refer you to an endocrinologist. This is a physician who is specialized in the treatment of glandular disorders. Document Released: 10/09/2001 Document Revised: 12/15/2013 Document Reviewed: 11/25/2008 [facility] Patient Information 2015 [employer] [place]. This information is not intended to replace advice given to you by your health care provider. Make sure you discuss any questions you have with your health care provider. Spironolactone tablets What is this medicine? SPIRONOLACTONE (speer on oh LAK tone) is a diuretic. It helps you make more urine and to lose excess water from your body. This medicine is used to treat high blood pressure, and edema or swelling from heart, kidney, or liver disease. It  is also used to treat patients who make too much aldosterone or have low potassium. This medicine may be used for other purposes; ask  your health care provider or pharmacist if you have questions. COMMON BRAND NAME(S): Aldactone What should I tell my health care provider before I take this medicine? They need to know if you have any of these conditions: -high blood level of potassium -kidney disease or trouble making urine -liver disease -an unusual or allergic reaction to spironolactone, other medicines, foods, dyes, or preservatives -pregnant or trying to get pregnant -breast-feeding How should I use this medicine? Take this medicine by mouth with a drink of water. Follow the directions on your prescription label. You can take it with or without food. If it upsets your stomach, take it with food. Do not take your medicine more often than directed. Remember that you will need to pass more urine after taking this medicine. Do not take your doses at a time of day that will cause you problems. Do not take at bedtime. Talk to your pediatrician regarding the use of this medicine in children. While this drug may be prescribed for selected conditions, precautions do apply. Overdosage: If you think you have taken too much of this medicine contact a poison control center or emergency room at once. NOTE: This medicine is only for you. Do not share this medicine with others. What if I miss a dose? If you miss a dose, take it as soon as you can. If it is almost time for your next dose, take only that dose. Do not take double or extra doses. What may interact with this medicine? Do not take this medicine with any of the following medications: -eplerenone This medicine may also interact with the following medications: -corticosteroids -digoxin -lithium -medicines for high blood pressure like ACE inhibitors -skeletal muscle relaxants like tubocurarine -NSAIDs, medicines for pain and inflammation, like ibuprofen or naproxen -potassium products like salt substitute or supplements -pressor amines like norepinephrine -some  diuretics This list may not describe all possible interactions. Give your health care provider a list of all the medicines, herbs, non-prescription drugs, or dietary supplements you use. Also tell them if you smoke, drink alcohol, or use illegal drugs. Some items may interact with your medicine. What should I watch for while using this medicine? Visit your doctor or health care professional for regular checks on your progress. Check your blood pressure as directed. Ask your doctor what your blood pressure should be, and when you should contact them. You may need to be on a special diet while taking this medicine. Ask your doctor. Also, ask how many glasses of fluid you need to drink a day. You must not get dehydrated. This medicine may make you feel confused, dizzy or lightheaded. Drinking alcohol and taking some medicines can make this worse. Do not drive, use machinery, or do anything that needs mental alertness until you know how this medicine affects you. Do not sit or stand up quickly. What side effects may I notice from receiving this medicine? Side effects that you should report to your doctor or health care professional as soon as possible: -allergic reactions such as skin rash or itching, hives, swelling of the lips, mouth, tongue, or throat -black or tarry stools -fast, irregular heartbeat -fever -muscle pain, cramps -numbness, tingling in hands or feet -trouble breathing -trouble passing urine -unusual bleeding -unusually weak or tired Side effects that usually do not require medical attention (report to your doctor or health  care professional if they continue or are bothersome): -change in voice or hair growth -confusion -dizzy, drowsy -dry mouth, increased thirst -enlarged or tender breasts -headache -irregular menstrual periods -sexual difficulty, unable to have an erection -stomach upset This list may not describe all possible side effects. Call your doctor for medical  advice about side effects. You may report side effects to FDA at 1-800-FDA-1088. Where should I keep my medicine? Keep out of the reach of children. Store below 25 degrees C (77 degrees F). Throw away any unused medicine after the expiration date. NOTE: This sheet is a summary. It may not cover all possible information. If you have questions about this medicine, talk to your doctor, pharmacist, or health care provider.  2015, Elsevier/Gold Standard. (2010-04-12 12:51:30)

## 2014-05-15 NOTE — Progress Notes (Signed)
Subjective:     Patient ID: Wendy Parsons, female   DOB: Mar 16, 1991, 23 y.o.   MRN: [id number]  HPI Pt reports a longtime h/o hirsutism.  Wants treatment. She shaves her face- chin and side; back; abd; chest.  She reports some clitoromegaly but is on her menses today and prefers to defer exam if possible.  She reports that her sx are worse on her current OCP's  Review of Systems     Objective:   Physical Exam BP 118/75  Pulse 55  Ht 5\' 5"  (1.651 m)  Wt 125 lb (56.7 kg)  BMI 20.80 kg/m2  LMP 05/12/2014 Skin: female pattern hair growth on face- thick course hair on chin in a full beard pattern.  There is hair pattern growth on her chest and back as well       Assessment:     Female hirsutism      Plan:     F/u in 3 months or sooner prn Reviewed the potential se of Spironolactone Spironolactone 200mg  daily (order written to increase dosage gradually) Change OCP's from Ortho-tricyclen to LoEstrin dialy  Labs: TSH, testosterone, DHEAS, FSH; 17 hydroxyprogesterone an dHbA1c today

## 2014-05-16 LAB — FOLLICLE STIMULATING HORMONE: FSH: 10 m[IU]/mL

## 2014-05-16 LAB — DHEA-SULFATE: DHEA SO4: 299 ug/dL (ref 18–391)

## 2014-05-18 ENCOUNTER — Telehealth: Payer: Self-pay | Admitting: *Deleted

## 2014-05-18 LAB — TESTOSTERONE, FREE, TOTAL, SHBG
SEX HORMONE BINDING: 99 nmol/L (ref 18–114)
TESTOSTERONE FREE: 2.9 pg/mL (ref 0.6–6.8)
Testosterone-% Free: 0.8 % (ref 0.4–2.4)
Testosterone: 35 ng/dL (ref 10–70)

## 2014-05-18 NOTE — Telephone Encounter (Signed)
Message copied by Louanna RawAMPBELL, Sten Dematteo M on Mon May 18, 2014 12:51 PM ------      Message from: Willodean RosenthalHARRAWAY-SMITH, CAROLYN      Created: Mon May 18, 2014 12:36 PM       Please call pt.  Inform her that her labs are normal and she should continue with the medications as prescribed.             Thx,      clh-S ------

## 2014-05-18 NOTE — Telephone Encounter (Signed)
Patient called and stated that she was prescribed two meds on Friday. She would like to get a written copy of the rx because she [facility]

## 2014-05-18 NOTE — Telephone Encounter (Signed)
Called patient and she states that she went to the pharmacy and her medication was going to cost $160 and her insurance does not cover medications. Told patient I would contact [facility] outpatient pharmacy to inquire what they're cost would be. Dr Erin FullingHarraway Smith stated patient does not need loestrin 24 but regular loestrin 1-20 without iron. [facility], cost will be less than $30 for both. Called patient back and informed her of cost and where Rx had been sent. Patient verbalized understanding and was satisfied. Informed patient of normal lab work. Patient verbalized understanding and had no other questions

## 2014-05-19 LAB — 17-HYDROXYPROGESTERONE: 17-OH-Progesterone, LC/MS/MS: 41 ng/dL

## 2014-05-22 ENCOUNTER — Telehealth: Payer: Self-pay

## 2014-05-22 NOTE — Telephone Encounter (Signed)
Patient called with questions regarding how often she is supposed to be taking a medication recently prescribed-- did not specify medication. Called patient who stated per RX bottle of spironolactone she is supposed to take 50mg  twice daily and increase to 100mg  BID after a certain amount of days (does not have bottle in front of her but knows it specifies when to increase). Per chart review, this is the correct instructions and patient is supposed to be increasing dosage gradually. Informed patient she is taking medication appropriately and should continue to follow RX instructions on bottle. Patient verbalized understanding and gratitude. No further questions or concerns.

## 2014-08-20 ENCOUNTER — Telehealth: Payer: Self-pay | Admitting: *Deleted

## 2014-08-20 NOTE — Telephone Encounter (Signed)
Spoke to Dr Erin FullingHarraway Smith, patient needs no follow up unless she would like to come in. The medication Spironolactone she is on is for her excessive hair growth so it will cause a thinning effect. Called patient, no answer- left message we are trying to return your phone call, please call us back at the clinics

## 2014-08-20 NOTE — Telephone Encounter (Signed)
Wendy Parsons called and left a message stating"  My doctor is Wendy Parsons and I calling about my current prescriptions for birth control and hormone theraphy medicines.  I was calling to give her an update on my status. So far I had to slow down on medicines and taking 1/2 tablet twice a day for a few weeks. I also experienced not on meds for about 2 weeks for personal inconveniences but I started taking one whole tablet twice a day. So far so good. I did notice a thinning of my hair on my hairline- not sure if this is a common side effect of the medicine or other reason. Please call me. "

## 2014-08-20 NOTE — Telephone Encounter (Signed)
Called Wendy Parsons and left a message we got your message and are calling you back. We have made a note of your call for your doctor and will call you back if needed, or you may call us back.

## 2014-08-21 NOTE — Telephone Encounter (Signed)
Contacted patient, pt is concerned about thinning hair line.  Pt is on medication that decreases hair growth.  Explained to patient that the medication will work on all hair on the body.  Pt cannot afford to come in for an appointment at this time, will call back after she tries some alternative treatment. Pt verbalizes understanding.

## 2014-09-17 ENCOUNTER — Telehealth: Payer: Self-pay

## 2014-09-17 NOTE — Telephone Encounter (Signed)
Pt called and asked if it was okay if she takes prenatal vitamins and biotin.  Pt left a secondary number, [phone], to contact her. Called pt @ secondary #  and informed pt that is okay to take prenatal vitamins and biotin with her BCP and spironolactone.  Pt stated understanding with no further questions.

## 2014-10-20 ENCOUNTER — Telehealth: Payer: Self-pay | Admitting: *Deleted

## 2014-10-20 NOTE — Telephone Encounter (Addendum)
Pt left message stating that she called the nurse line last night. They said they will send information about the call and she should call the office after 0900 today. She wants to speak with someone about side effects from Spironolactone. I returned pt's call and she stated that she has had financial issues lately and has not been able to take her medication properly. She had no medication for 3 weeks and has recently resumed taking it. She admits that she sometimes does not remember to take all doses as prescribed and may have 2 days out of the week where she only takes the medication once instead of twice. Now she has noticed some spotting and wants to know if this is normal or is of any concern. I advised pt that we would not be concerned with a light amount of bleeding such as spotting. This may have been caused by the brief interruption in her medication. Her pharmacist may have additional information regarding side effects of the medication. She should make every effort to take the medication as prescribed twice daily. If the bleeding becomes very heavy she should call back or go to [facility] for evaluation. Pt voiced understanding and had no further questions.

## 2014-11-12 ENCOUNTER — Telehealth: Payer: Self-pay | Admitting: General Practice

## 2014-11-12 NOTE — Telephone Encounter (Signed)
Patient called and left message stating she was prescribed OCPs and aldactone and wants to know if she can take wild yam, saw palmetto, dong quai and damina. She didn't know if that would interfere with her birth control or not. Spoke to Dr Debroah LoopArnold who felt herbal supplements would be fine. Called patient and informed her and also discussed that with some herbal supplements extensive testing may or may not have been done to see if they contraindicate with birth control. Patient verbalized understanding and had no other questions

## 2014-11-20 ENCOUNTER — Other Ambulatory Visit: Payer: Self-pay | Admitting: General Practice

## 2014-11-20 NOTE — Telephone Encounter (Signed)
Patient called and left message stating she has a prescription for spironolactone. States she tried to refill the medication but was told by the pharmacy that the doctor denied her refill stating she needs to come in. Patient states she needs the prescription refilled, please call back. Will route to Dr Erin FullingHarraway-Smith. Called patient letting her know that Dr Erin FullingHarraway Smith isn't in the office today but we have sent her request for a refill to her to review. Told patient that Dr Erin FullingHarraway Smith will be back in the office on Monday so if she doesn't hear anything over the weekend she will definitely be back on Monday in the office. Patient verbalized understanding and had no other questions at this time

## 2014-11-23 ENCOUNTER — Other Ambulatory Visit: Payer: Self-pay | Admitting: Obstetrics & Gynecology

## 2014-11-23 DIAGNOSIS — L680 Hirsutism: Secondary | ICD-10-CM

## 2014-11-23 MED ORDER — SPIRONOLACTONE 100 MG PO TABS: 200.0000 mg | ORAL_TABLET | Freq: Every day | ORAL | Status: DC

## 2014-11-23 NOTE — Telephone Encounter (Signed)
Called patient and informed her of prescription refill. Patient verbalized understanding and asked if the doctor said anything about an herbal supplement she bought recently. Told patient I do not see where someone has talked to her about that. Patient states oh okay that's fine, I'll just return it. Patient had no other questions

## 2014-12-01 ENCOUNTER — Telehealth: Payer: Self-pay

## 2014-12-01 NOTE — Telephone Encounter (Signed)
Patient called stating she sent request for refill for spironolactone last week but no response. Per chart review, medication prescribed on 11/23/14 with 12 refills. Called [facility] and was informed medication is ready for pick up. Called patient. No answer. Left message stating medication is ready for pick up at [facility] on [address] and there are 12 refills available, call clinic with any further questions or concerns.

## 2014-12-02 ENCOUNTER — Other Ambulatory Visit: Payer: Self-pay | Admitting: *Deleted

## 2014-12-02 DIAGNOSIS — L680 Hirsutism: Secondary | ICD-10-CM

## 2014-12-02 MED ORDER — SPIRONOLACTONE 100 MG PO TABS: 200.0000 mg | ORAL_TABLET | Freq: Every day | ORAL | Status: DC

## 2014-12-02 NOTE — Progress Notes (Signed)
[facility]  Pharmacy changed and medication reordered.

## 2015-03-15 ENCOUNTER — Telehealth: Payer: Self-pay | Admitting: *Deleted

## 2015-03-15 DIAGNOSIS — Z3041 Encounter for surveillance of contraceptive pills: Secondary | ICD-10-CM

## 2015-03-15 MED ORDER — NORETHINDRONE ACET-ETHINYL EST 1-20 MG-MCG PO TABS: 1.0000 | ORAL_TABLET | Freq: Every day | ORAL | Status: DC

## 2015-03-15 NOTE — Telephone Encounter (Signed)
Received message left by patient on nurse line 03/15/15 at 1018.  Patient states she needs a refill on her birth control.  States she went to the pharmacy and they told her they would send something to [place] but that it usually takes 24-48 hours patients states she is out of medication.  Would like a return call.

## 2015-03-15 NOTE — Telephone Encounter (Signed)
Received call transferred from [facility].  Patient states she needs her OCP refill today.  Explained to patient I would have it sent to her pharmacy before 5 pm today.  Patient states understanding.  Medication ordered.

## 2015-04-05 ENCOUNTER — Telehealth: Payer: Self-pay

## 2015-04-05 NOTE — Telephone Encounter (Signed)
Pt called and wanted to know if she could skip the last week of her pills to prevent her from having a period.

## 2015-04-07 NOTE — Telephone Encounter (Signed)
Contacted patient, informed patient that it is okay to skip last set of pills to prevent period.  Pt has not been seen in clinic since 2013, pt states she has no insurance.  Encouraged patient to contact local health department for care.

## 2015-04-07 NOTE — Telephone Encounter (Signed)
Called patient, no answer- left message stating we are trying to reach you to return your phone call, please call us back at the clinics 

## 2015-07-21 NOTE — Progress Notes (Signed)
Pt came to the front desk and stated that she is taking spirolactone for PCOS. She stated that the cost of her medication is too expensive and wants to know if she can take herbal medicines such as: Macroroot, [place] Wild Yam, [address], and [place] Pal[place]to.  Spoke with Dr. Macon LargeAnyanwu, provider recommendations was not to take those herbal medications to continue the spirolactone with BCP regimen to manage PCOS due to the herbal medication listed have not been studied enough to be beneficial and that they have hormones in them, estrogen.  Informed pt of providers advisement and also let her know [facility] a Good Rx card that they should have at there pharmacy.  Pt stated understanding.

## 2016-09-01 ENCOUNTER — Other Ambulatory Visit (HOSPITAL_COMMUNITY): Payer: Self-pay | Admitting: Advanced Practice Midwife

## 2016-09-01 DIAGNOSIS — R102 Pelvic and perineal pain: Secondary | ICD-10-CM

## 2016-09-06 ENCOUNTER — Ambulatory Visit (HOSPITAL_COMMUNITY): Payer: BLUE CROSS/BLUE SHIELD

## 2016-09-06 ENCOUNTER — Encounter (HOSPITAL_COMMUNITY): Payer: Self-pay

## 2018-08-21 ENCOUNTER — Ambulatory Visit: Payer: BLUE CROSS/BLUE SHIELD | Admitting: Obstetrics and Gynecology

## 2018-08-21 ENCOUNTER — Encounter: Payer: Self-pay | Admitting: Obstetrics and Gynecology

## 2018-08-21 VITALS — BP 111/67 | HR 68 | Ht 65.0 in | Wt 131.4 lb

## 2018-08-21 DIAGNOSIS — Z113 Encounter for screening for infections with a predominantly sexual mode of transmission: Secondary | ICD-10-CM

## 2018-08-21 DIAGNOSIS — N898 Other specified noninflammatory disorders of vagina: Secondary | ICD-10-CM | POA: Diagnosis not present

## 2018-08-21 DIAGNOSIS — N761 Subacute and chronic vaginitis: Secondary | ICD-10-CM | POA: Diagnosis not present

## 2018-08-21 NOTE — Patient Instructions (Signed)
Diet for Polycystic Ovary Syndrome  Polycystic ovary syndrome (PCOS) is a disorder of the chemicals (hormones) that regulate a woman's reproductive system, including monthly periods (menstruation). The condition causes important hormones to be out of balance. PCOS can:  · Stop your periods or make them irregular.  · Cause cysts to develop on your ovaries.  · Make it difficult to get pregnant.  · Stop your body from responding to the effects of insulin (insulin resistance). Insulin resistance can lead to obesity and diabetes.  Changing what you eat can help you manage PCOS and improve your health. Following a balanced diet can help you lose weight and improve the way that your body uses insulin.  What are tips for following this plan?  · Follow a balanced diet for meals and snacks. Eat breakfast, lunch, dinner, and one or two snacks every day.  · Include protein in each meal and snack.  · Choose whole grains instead of products that are made with refined flour.  · Eat a variety of foods.  · Exercise regularly as told by your health care provider. Aim to do 30 or more minutes of exercise on most days of the week.  · If you are overweight or obese:  ? Pay attention to how many calories you eat. Cutting down on calories can help you lose weight.  ? Work with your health care provider or a diet and nutrition specialist (dietitian) to figure out how many calories you need each day.  What foods can I eat?    Fruits  Include a variety of colors and types. All fruits are helpful for PCOS.  Vegetables  Include a variety of colors and types. All vegetables are helpful for PCOS.  Grains  Whole grains, such as whole wheat. Whole-grain breads, crackers, cereals, and pasta. Unsweetened oatmeal, bulgur, barley, quinoa, and brown rice. Tortillas made from corn or whole-wheat flour.  Meats and other proteins  Low-fat (lean) proteins, such as fish, chicken, beans, eggs, and tofu.  Dairy  Low-fat dairy products, such as skim milk,  cheese sticks, and yogurt.  Beverages  Low-fat or fat-free drinks, such as water, low-fat milk, sugar-free drinks, and small amounts of 100% fruit juice.  Seasonings and condiments  Ketchup. Mustard. Barbecue sauce. Relish. Low-fat or fat-free mayonnaise.  Fats and oils  Olive oil or canola oil. Walnuts and almonds.  The items listed above may not be a complete list of recommended foods and beverages. Contact a dietitian for more options.  What foods are not recommended?  Foods that are high in calories or fat. Fried foods. Sweets. Products that are made from refined white flour, including white bread, pastries, white rice, and pasta.  The items listed above may not be a complete list of foods and beverages to avoid. Contact a dietitian for more information.  Summary  · PCOS is a hormonal imbalance that affects a woman's reproductive system.  · You can help to manage your PCOS by exercising regularly and eating a healthy, varied diet of vegetables, fruit, whole grains, low-fat (lean) protein, and low-fat dairy products.  · Changing what you eat can improve the way that your body uses insulin, help your hormones reach normal levels, and help you lose weight.  This information is not intended to replace advice given to you by your health care provider. Make sure you discuss any questions you have with your health care provider.  Document Released: 11/22/2015 Document Revised: 06/04/2017 Document Reviewed: 06/04/2017  Elsevier Interactive Patient   Education © 2019 Elsevier Inc.

## 2018-08-21 NOTE — Progress Notes (Signed)
Patient is in the office for new GYN. LMP 08-13-18 Pt reports last pap was last month.  Pt reports vaginal itching, odor, itching, states that this a recurring issue. Pt desires std testing. Pt is currently taking BC pills.

## 2018-08-21 NOTE — Progress Notes (Signed)
28 yo P0 here for the evaluation of vaginitis. Patient reports the recurrence over the past few months of BV infection. She has significantly changed her diet to vegan which has improved her symptoms but states that she consumed a lot of animal products during the holidays. She denies pruritis or pelvic pain but reports the presence of a vaginal odor. She is sexually active using COC for contraception. Patient was diagnosed with PCOS and has significant hirsutism (facial, chest, and back) which she waxes regularly. She discontinued spironolactone due to side effects.  Past Medical History:  Diagnosis Date  . Bronchitis   . PCOS (polycystic ovarian syndrome)    Past Surgical History:  Procedure Laterality Date  . WISDOM TOOTH EXTRACTION     Family History  Problem Relation Age of Onset  . Breast cancer Other    Social History   Tobacco Use  . Smoking status: Never Smoker  . Smokeless tobacco: Never Used  Substance Use Topics  . Alcohol use: Yes  . Drug use: Yes    Types: Marijuana   ROS See pertinent in HPI  Blood pressure 111/67, pulse 68, height 5\' 5"  (1.651 m), weight 131 lb 6.4 oz (59.6 kg), last menstrual period 08/13/2018. GENERAL: Well-developed, well-nourished female in no acute distress.  ABDOMEN: Soft, nontender, nondistended. No organomegaly. PELVIC: Normal external female genitalia. Vagina is pink and rugated.  Normal discharge. Normal appearing cervix. Uterus is normal in size. No adnexal mass or tenderness. EXTREMITIES: No cyanosis, clubbing, or edema, 2+ distal pulses.  A/P 28 yo with recurrent vaginitis - Wet prep collected - Patient declined STI testing (including GC/GN) as she is in a 2-year monogamous relationship - Patient will be contacted with abnormal results - Patient with normal pap smear with PCP 07/2018 - RTC prn

## 2018-08-22 LAB — CERVICOVAGINAL ANCILLARY ONLY
Bacterial vaginitis: NEGATIVE
CANDIDA VAGINITIS: NEGATIVE
TRICH (WINDOWPATH): NEGATIVE

## 2019-10-31 ENCOUNTER — Ambulatory Visit: Payer: BLUE CROSS/BLUE SHIELD

## 2019-11-24 ENCOUNTER — Ambulatory Visit: Payer: Self-pay | Attending: Internal Medicine

## 2019-11-24 DIAGNOSIS — Z23 Encounter for immunization: Secondary | ICD-10-CM

## 2019-11-24 NOTE — Progress Notes (Signed)
   Covid-19 Vaccination Clinic  Name:  Wendy Parsons    MRN: [id number] DOB: May 02, 1991  11/24/2019  [name] was observed post Covid-19 immunization for 15 minutes without incident. She was provided with Vaccine Information Sheet and instruction to access the V-Safe system.   [name] was instructed to call 911 with any severe reactions post vaccine: Marland Kitchen Difficulty breathing  . Swelling of face and throat  . A fast heartbeat  . A bad rash all over body  . Dizziness and weakness   Immunizations Administered    Name Date Dose VIS Date Route   Pfizer COVID-19 Vaccine 11/24/2019 12:39 PM 0.3 mL 07/25/2019 Intramuscular   Manufacturer: [employer], [employer]   Lot: [id number]   NDC: [address]-9

## 2019-12-15 ENCOUNTER — Ambulatory Visit: Payer: Self-pay | Attending: Internal Medicine

## 2019-12-15 DIAGNOSIS — Z23 Encounter for immunization: Secondary | ICD-10-CM

## 2019-12-15 NOTE — Progress Notes (Signed)
   Covid-19 Vaccination Clinic  Name:  Wendy Parsons    MRN: [id number] DOB: September 12, 1990  12/15/2019  [name] was observed post Covid-19 immunization for 15 minutes without incident. She was provided with Vaccine Information Sheet and instruction to access the V-Safe system.   [name] was instructed to call 911 with any severe reactions post vaccine: Marland Kitchen Difficulty breathing  . Swelling of face and throat  . A fast heartbeat  . A bad rash all over body  . Dizziness and weakness   Immunizations Administered    Name Date Dose VIS Date Route   Pfizer COVID-19 Vaccine 12/15/2019  2:18 PM 0.3 mL 10/08/2018 Intramuscular   Manufacturer: [employer], [employer]   Lot: [id number]   NDC: [address]-8

## 2021-04-05 ENCOUNTER — Ambulatory Visit: Payer: Self-pay | Admitting: *Deleted

## 2021-04-05 NOTE — Telephone Encounter (Signed)
Pt reports dime size "Lump" back side of neck at hairline, right side. Also notes small lump right side of collarbone. States lumps are "Hard but squishy." Denies pain. No difficulty swallowing. H/O tonsil stones, concerned as "Never really went away from years ago." Reports bilateral ear pain. No PCP. Advised Mobile Unit, information provided. States will follow disposition.

## 2021-04-05 NOTE — Telephone Encounter (Signed)
Reason for Disposition  [1] Small swelling or lump AND [2] unexplained AND [3] present > 1 week  Answer Assessment - Initial Assessment Questions 1. APPEARANCE of SWELLING: "What does it look like?" (e.g., lymph node, insect bite, mole)     Under skin 2. SIZE: "How large is the swelling?" (e.g., inches, cm; or compare to size of pinhead, tip of pen, eraser, coin, pea, grape, ping pong ball)      Dime size 3. LOCATION: "Where is the swelling located?"     Right side of neck at hairline right collarbone area 4. ONSET: "When did the swelling start?"    Today 5. PAIN: "Is it painful?" If Yes, ask: "How much?"     No 6. ITCH: "Does it itch?" If Yes, ask: "How much?"     No 7. CAUSE: "What do you think caused the swelling?"     Unsure 8. OTHER SYMPTOMS: "Do you have any other symptoms?" (e.g., fever)     Throat pressure, ear pain.  Protocols used: Skin Lump or Localized Swelling-A-AH

## 2021-04-06 ENCOUNTER — Other Ambulatory Visit: Payer: Self-pay

## 2021-04-06 ENCOUNTER — Ambulatory Visit: Payer: 59 | Admitting: Physician Assistant

## 2021-04-06 VITALS — BP 107/76 | HR 75 | Temp 98.2°F | Resp 18 | Ht 65.0 in | Wt 138.0 lb

## 2021-04-06 DIAGNOSIS — E282 Polycystic ovarian syndrome: Secondary | ICD-10-CM | POA: Diagnosis not present

## 2021-04-06 DIAGNOSIS — Z13220 Encounter for screening for lipoid disorders: Secondary | ICD-10-CM

## 2021-04-06 DIAGNOSIS — R221 Localized swelling, mass and lump, neck: Secondary | ICD-10-CM | POA: Diagnosis not present

## 2021-04-06 DIAGNOSIS — F439 Reaction to severe stress, unspecified: Secondary | ICD-10-CM

## 2021-04-06 DIAGNOSIS — L680 Hirsutism: Secondary | ICD-10-CM

## 2021-04-06 DIAGNOSIS — Z114 Encounter for screening for human immunodeficiency virus [HIV]: Secondary | ICD-10-CM

## 2021-04-06 DIAGNOSIS — R718 Other abnormality of red blood cells: Secondary | ICD-10-CM

## 2021-04-06 DIAGNOSIS — Z6822 Body mass index (BMI) 22.0-22.9, adult: Secondary | ICD-10-CM

## 2021-04-06 DIAGNOSIS — J351 Hypertrophy of tonsils: Secondary | ICD-10-CM

## 2021-04-06 DIAGNOSIS — Z1159 Encounter for screening for other viral diseases: Secondary | ICD-10-CM

## 2021-04-06 DIAGNOSIS — Z13228 Encounter for screening for other metabolic disorders: Secondary | ICD-10-CM

## 2021-04-06 NOTE — Patient Instructions (Addendum)
I have started a referral for you to be seen by gynecology for further evaluation of your PCOS and hirsutism.  I have started a referral for you to be seen by ENT for your swollen tonsils and tonsil stones.  I have started a referral for you to be seen by the clinic counselor to help with your elevated stress and anxiety.  I do encourage you to make sure that you are keeping a good sleep schedule, it is fine to continue using melatonin as needed.  We will call you with today's lab results.  Kennieth Rad, PA-C Physician Assistant [facility] [email or web address]   Insomnia Insomnia is a sleep disorder that makes it difficult to fall asleep or stay asleep. Insomnia can cause fatigue, low energy, difficulty concentrating, moodswings, and poor performance at work or school. There are three different ways to classify insomnia: Difficulty falling asleep. Difficulty staying asleep. Waking up too early in the morning. Any type of insomnia can be long-term (chronic) or short-term (acute). Both are common. Short-term insomnia usually lasts for three months or less. Chronic insomnia occurs at least three times a week for longer than threemonths. What are the causes? Insomnia may be caused by another condition, situation, or substance, such as: Anxiety. Certain medicines. Gastroesophageal reflux disease (GERD) or other gastrointestinal conditions. Asthma or other breathing conditions. Restless legs syndrome, sleep apnea, or other sleep disorders. Chronic pain. Menopause. Stroke. Abuse of alcohol, tobacco, or illegal drugs. Mental health conditions, such as depression. Caffeine. Neurological disorders, such as Alzheimer's disease. An overactive thyroid (hyperthyroidism). Sometimes, the cause of insomnia may not be known. What increases the risk? Risk factors for insomnia include: Gender. Women are affected more often than men. Age.  Insomnia is more common as you get older. Stress. Lack of exercise. Irregular work schedule or working night shifts. Traveling between different time zones. Certain medical and mental health conditions. What are the signs or symptoms? If you have insomnia, the main symptom is having trouble falling asleep or having trouble staying asleep. This may lead to other symptoms, such as: Feeling fatigued or having low energy. Feeling nervous about going to sleep. Not feeling rested in the morning. Having trouble concentrating. Feeling irritable, anxious, or depressed. How is this diagnosed? This condition may be diagnosed based on: Your symptoms and medical history. Your health care provider may ask about: Your sleep habits. Any medical conditions you have. Your mental health. A physical exam. How is this treated? Treatment for insomnia depends on the cause. Treatment may focus on treating an underlying condition that is causing insomnia. Treatment may also include: Medicines to help you sleep. Counseling or therapy. Lifestyle adjustments to help you sleep better. Follow these instructions at home: Eating and drinking  Limit or avoid alcohol, caffeinated beverages, and cigarettes, especially close to bedtime. These can disrupt your sleep. Do not eat a large meal or eat spicy foods right before bedtime. This can lead to digestive discomfort that can make it hard for you to sleep.  Sleep habits  Keep a sleep diary to help you and your health care provider figure out what could be causing your insomnia. Write down: When you sleep. When you wake up during the night. How well you sleep. How rested you feel the next day. Any side effects of medicines you are taking. What you eat and drink. Make your bedroom a dark, comfortable place where it is easy to fall asleep. Put up shades or blackout curtains to block light  from outside. Use a white noise machine to block noise. Keep the  temperature cool. Limit screen use before bedtime. This includes: Watching TV. Using your smartphone, tablet, or computer. Stick to a routine that includes going to bed and waking up at the same times every day and night. This can help you fall asleep faster. Consider making a quiet activity, such as reading, part of your nighttime routine. Try to avoid taking naps during the day so that you sleep better at night. Get out of bed if you are still awake after 15 minutes of trying to sleep. Keep the lights down, but try reading or doing a quiet activity. When you feel sleepy, go back to bed.  General instructions Take over-the-counter and prescription medicines only as told by your health care provider. Exercise regularly, as told by your health care provider. Avoid exercise starting several hours before bedtime. Use relaxation techniques to manage stress. Ask your health care provider to suggest some techniques that may work well for you. These may include: Breathing exercises. Routines to release muscle tension. Visualizing peaceful scenes. Make sure that you drive carefully. Avoid driving if you feel very sleepy. Keep all follow-up visits as told by your health care provider. This is important. Contact a health care provider if: You are tired throughout the day. You have trouble in your daily routine due to sleepiness. You continue to have sleep problems, or your sleep problems get worse. Get help right away if: You have serious thoughts about hurting yourself or someone else. If you ever feel like you may hurt yourself or others, or have thoughts about taking your own life, get help right away. You can go to your nearest emergency department or call: Your local emergency services (911 in the [place] A suicide crisis helpline, such as the [facility] at 519-674-4397. This is open 24 hours a day. Summary Insomnia is a sleep disorder that makes it difficult to fall  asleep or stay asleep. Insomnia can be long-term (chronic) or short-term (acute). Treatment for insomnia depends on the cause. Treatment may focus on treating an underlying condition that is causing insomnia. Keep a sleep diary to help you and your health care provider figure out what could be causing your insomnia. This information is not intended to replace advice given to you by your health care provider. Make sure you discuss any questions you have with your healthcare provider. Document Revised: 06/10/2020 Document Reviewed: 06/10/2020 Elsevier Patient Education  2022 [place], Adult Feeling a certain amount of stress is normal. Stress helps our body and mind get ready to deal with the demands of life. Stress hormones can motivate you to do well at work and meet your responsibilities. However severe or long-lasting (chronic) stress can affect your mental and physical health. Chronic stress puts you at higher risk for anxiety, depression, and other health problems like digestive problems, muscle aches, heart disease, high blood pressure, and stroke. What are the causes? Common causes of stress include: Demands from work, such as deadlines, feeling overworked, or having long hours. Pressures at home, such as money issues, disagreements with a spouse, or parenting issues. Pressures from major life changes, such as divorce, moving, loss of a loved one, or chronic illness. You may be at higher risk for stress-related problems if you do not get enough sleep, are in poor health, do not have emotional support, or have a mental health disorder like anxiety or depression. How to recognize stress  Stress can make you: Have trouble sleeping. Feel sad, anxious, irritable, or overwhelmed. Lose your appetite. Overeat or want to eat unhealthy foods. Want to use drugs or alcohol. Stress can also cause physical symptoms, such as: Sore, tense muscles, especially in the shoulders and  neck. Headaches. Trouble breathing. A faster heart rate. Stomach pain, nausea, or vomiting. Diarrhea or constipation. Trouble concentrating. Follow these instructions at home: Lifestyle Identify the source of your stress and your reaction to it. See a therapist who can help you change your reactions. When there are stressful events: Talk about it with family, friends, or co-workers. Try to think realistically about stressful events and not ignore them or overreact. Try to find the positives in a stressful situation and not focus on the negatives. Cut back on responsibilities at work and home, if possible. Ask for help from friends or family members if you need it. Find ways to cope with stress, such as: Meditation. Deep breathing. Yoga or tai chi. Progressive muscle relaxation. Doing art, playing music, or reading. Making time for fun activities. Spending time with family and friends. Get support from family, friends, or spiritual resources. Eating and drinking Eat a healthy diet. This includes: Eating foods that are high in fiber, such as beans, whole grains, and fresh fruits and vegetables. Limiting foods that are high in fat and processed sugars, such as fried and sweet foods. Do not skip meals or overeat. Drink enough fluid to keep your urine pale yellow. Alcohol use Do not drink alcohol if: Your health care provider tells you not to drink. You are pregnant, may be pregnant, or are planning to become pregnant. Drinking alcohol is a way some people try to ease their stress. This can be dangerous, so if you drink alcohol: Limit how much you use to: 0-1 drink a day for women. 0-2 drinks a day for men. Be aware of how much alcohol is in your drink. In the [place] one drink equals one 12 oz bottle of beer (355 mL), one 5 oz glass of wine (148 mL), or one 1 oz glass of hard liquor (44 mL). Activity  Include 30 minutes of exercise in your daily schedule. Exercise is a good stress  reducer. Include time in your day for an activity that you find relaxing. Try taking a walk, going on a bike ride, reading a book, or listening to music. Schedule your time in a way that lowers stress, and keep a consistent schedule. Prioritize what is most important to get done. General instructions Get enough sleep. Try to go to sleep and get up at about the same time every day. Take over-the-counter and prescription medicines only as told by your health care provider. Do not use any products that contain nicotine or tobacco, such as cigarettes, e-cigarettes, and chewing tobacco. If you need help quitting, ask your health care provider. Do not use drugs or smoke to cope with stress. Keep all follow-up visits as told by your health care provider. This is important. Where to find support Talk with your health care provider about stress management or finding a support group. Find a therapist to work with you on your stress management techniques. Contact a health care provider if: Your stress symptoms get worse. You are unable to manage your stress at home. You are struggling to stop using drugs or alcohol. Get help right away if: You may be a danger to yourself or others. You have any thoughts of death or suicide. If you ever feel  like you may hurt yourself or others, or have thoughts about taking your own life, get help right away. You can go to your nearest emergency department or call: Your local emergency services (911 in the [place] A suicide crisis helpline, such as the [facility] at 734-435-1280. This is open 24 hours a day. Summary Feeling a certain amount of stress is normal, but severe or long-lasting (chronic) stress can affect your mental and physical health. Chronic stress can put you at higher risk for anxiety, depression, and other health problems like digestive problems, muscle aches, heart disease, high blood pressure, and stroke. You may be at  higher risk for stress-related problems if you do not get enough sleep, are in poor health, lack emotional support, or have a mental health disorder like anxiety or depression. Identify the source of your stress and your reaction to it. Try talking about stressful events with family, friends, or co-workers, finding a coping method, or getting support from spiritual resources. If you need more help, talk with your health care provider about finding a support group or a mental health therapist. This information is not intended to replace advice given to you by your health care provider. Make sure you discuss any questions you have with your health care provider. Document Revised: 10/08/2020 Document Reviewed: 02/26/2019 Elsevier Patient Education  [place].

## 2021-04-06 NOTE — Progress Notes (Signed)
New Patient Office Visit  Subjective:  Patient ID: ARTELIA GAME, adult    DOB: 11/18/1990  Age: 30 y.o. MRN: [id number]  CC:  Chief Complaint  Patient presents with   Cyst    Right side of neck    HPI Wendy Parsons reports that she noticed a bump on the right side of her neck yesterday.  Denies tenderness, but does endorse that it is starting to feel "a little sensitive".  Endorses that she feels it has gotten a little smaller today.  Has not tried anything for relief.  Reports that she has been experiencing enlarged tonsils and tonsil stones for the past 2 years.  Reports that she has also been having pain in her throat, describes it as throbbing, like someone punched her in the throat, feels like a bruise.  Reports that it is worse when she drinks coffee.  Reports that this has also been present for the past 2 years.  Has not tried anything for relief.  Reports that she has been having elevated stress and anxiety.  Reports that she decided to take a break from her graduate school program because she is also working full-time.  Reports that she has been having difficulty falling asleep due to racing thoughts.  Reports that she will use melatonin with relief and is sleeping approximately 8 hours a night.  States that she has been working with a Patent examiner and doing wellness work, but does "wish she could afford a better therapist".  Reports that she has also been using the headspace app that she receives free through her work.  Reports that she was diagnosed with PCOS at age 35, has been seen at [facility] who prescribes her birth control.  States that her biggest concern is hirsutism   Past Medical History:  Diagnosis Date   Bronchitis    PCOS (polycystic ovarian syndrome)     Past Surgical History:  Procedure Laterality Date   WISDOM TOOTH EXTRACTION      Family History  Problem Relation Age of Onset   Breast cancer Other     Social History   Socioeconomic  History   Marital status: Single    Spouse name: Not on file   Number of children: Not on file   Years of education: Not on file   Highest education level: Not on file  Occupational History   Not on file  Tobacco Use   Smoking status: Never   Smokeless tobacco: Never  Substance and Sexual Activity   Alcohol use: Yes   Drug use: Yes    Types: Marijuana   Sexual activity: Yes    Birth control/protection: Pill  Other Topics Concern   Not on file  Social History Narrative   Patient resides with partner and partners mother.   Patient attended grad school for public relations and has taken this semester off. (04/06/21).   Social Determinants of Health   Financial Resource Strain: Not on file  Food Insecurity: Not on file  Transportation Needs: Not on file  Physical Activity: Not on file  Stress: Not on file  Social Connections: Not on file  Intimate Partner Violence: Not on file    ROS Review of Systems  Constitutional:  Negative for chills and fever.  HENT:  Negative for congestion, sore throat and trouble swallowing.   Eyes: Negative.   Respiratory:  Negative for shortness of breath.   Cardiovascular:  Negative for chest pain.  Gastrointestinal: Negative.   Endocrine:  Negative.   Genitourinary: Negative.   Musculoskeletal: Negative.   Skin: Negative.   Allergic/Immunologic: Negative.   Neurological: Negative.   Hematological: Negative.   Psychiatric/Behavioral:  Positive for sleep disturbance. Negative for self-injury and suicidal ideas. The patient is nervous/anxious.    Objective:   Today's Vitals: BP 107/76 (BP Location: Left Arm, Patient Position: Sitting, Cuff Size: Normal)   Pulse 75   Temp 98.2 F (36.8 C) (Oral)   Resp 18   Ht 5\' 5"  (1.651 m)   Wt 138 lb (62.6 kg)   SpO2 100%   BMI 22.96 kg/m   Physical Exam Vitals and nursing note reviewed.  Constitutional:      Appearance: Normal appearance.  HENT:     Head: Normocephalic and atraumatic.      Salivary Glands: Right salivary gland is diffusely enlarged. Left salivary gland is diffusely enlarged.     Right Ear: Tympanic membrane, ear canal and external ear normal.     Left Ear: Tympanic membrane, ear canal and external ear normal.     Nose: Nose normal.     Mouth/Throat:     Lips: Pink.     Mouth: Mucous membranes are moist.     Pharynx: Posterior oropharyngeal erythema present.     Tonsils: No tonsillar exudate or tonsillar abscesses. 1+ on the right. 1+ on the left.  Eyes:     Conjunctiva/sclera: Conjunctivae normal.     Pupils: Pupils are equal, round, and reactive to light.  Neck:     Thyroid: No thyroid mass or thyromegaly.      Comments: Nontender, pea-sized, mobile subcutaneous nodule noted Cardiovascular:     Rate and Rhythm: Normal rate and regular rhythm.  Pulmonary:     Effort: Pulmonary effort is normal.     Breath sounds: Normal breath sounds.  Musculoskeletal:        General: Normal range of motion.     Cervical back: Normal range of motion and neck supple.  Skin:    General: Skin is warm and dry.  Neurological:     General: No focal deficit present.     Mental Status: Wendy Parsons is alert and oriented to person, place, and time.  Psychiatric:        Mood and Affect: Mood normal.        Behavior: Behavior normal.        Thought Content: Thought content normal.        Judgment: Judgment normal.    Assessment & Plan:   Problem List Items Addressed This Visit       Endocrine   PCOS (polycystic ovarian syndrome) - Primary   Relevant Orders   Ambulatory referral to Gynecology     Musculoskeletal and Integument   HIRSUTISM     Other   Stress   Relevant Orders   Ambulatory referral to Social Work   Nodule of neck   Enlarged tonsils   Relevant Orders   Ambulatory referral to ENT   BMI 22.0-22.9, adult   Other Visit Diagnoses     Screening for HIV (human immunodeficiency virus)       Relevant Orders   HIV antibody (with reflex)    Screening, lipid       Relevant Orders   Lipid panel   Screening for metabolic disorder       Relevant Orders   CBC with Differential/Platelet   Comp. Metabolic Panel (12)   TSH   Encounter for HCV screening test for low  risk patient       Relevant Orders   HCV Ab w Reflex to Quant PCR       Outpatient Encounter Medications as of 04/06/2021  Medication Sig   norgestimate-ethinyl estradiol (ORTHO-CYCLEN,SPRINTEC,PREVIFEM) 0.25-35 MG-MCG tablet Take 1 tablet by mouth daily.   [DISCONTINUED] Norethindrone Acetate-Ethinyl Estrad-FE (LOESTRIN 24 FE) 1-20 MG-MCG(24) tablet Take 1 tablet by mouth daily.   [DISCONTINUED] norethindrone-ethinyl estradiol (MICROGESTIN,JUNEL,LOESTRIN) 1-20 MG-MCG tablet Take 1 tablet by mouth daily.   [DISCONTINUED] spironolactone (ALDACTONE) 100 MG tablet Take 2 tablets (200 mg total) by mouth daily. Will start with 50 mg twice daily, and increase to 100 mg twice daily as needed (Patient not taking: Reported on 08/21/2018)   No facility-administered encounter medications on file as of 04/06/2021.   1. Nodule of neck Patient does endorse that this does seem to be resolving on its own, watchful waiting.  Red flags given for prompt reevaluation.  Patient was given an appointment to establish care with Dr. Andrey Campanile at [facility] on May 10, 2021.  2. PCOS (polycystic ovarian syndrome)  - Ambulatory referral to Gynecology  3. Hirsutism   4. Stress Patient education given on good sleep hygiene, continue melatonin over-the-counter as needed, referral to clinical counselor for CBT. - Ambulatory referral to Social Work  5. Enlarged tonsils  - Ambulatory referral to ENT  6. Screening for HIV (human immunodeficiency virus)  - HIV antibody (with reflex)  7. Screening, lipid  - Lipid panel  8. Screening for metabolic disorder  - CBC with Differential/Platelet - Comp. Metabolic Panel (12) - TSH  9. Encounter for HCV screening test for low  risk patient  - HCV Ab w Reflex to Quant PCR  10. BMI 22.0-22.9, adult    I have reviewed the patient's medical history (PMH, PSH, Social History, Family History, Medications, and allergies) , and have been updated if relevant. I spent 37 minutes reviewing chart and  face to face time with patient.    Follow-up: Return in about 1 month (around 05/10/2021) for with Dr. Andrey Campanile at [facility].   Kasandra Knudsen Mayers, PA-C

## 2021-04-06 NOTE — Progress Notes (Signed)
Patient noticed a lump yesterday on the right side of the neck with pressure and relieved today. Patient reports bug bites on right shoulder. Patient denies pain or tenderness in the area. Patient has not taken medication today. Patient reports intermittent tension HA's on the right side behind her eye.

## 2021-04-07 ENCOUNTER — Telehealth: Payer: Self-pay | Admitting: *Deleted

## 2021-04-07 DIAGNOSIS — Z6822 Body mass index (BMI) 22.0-22.9, adult: Secondary | ICD-10-CM | POA: Insufficient documentation

## 2021-04-07 DIAGNOSIS — E282 Polycystic ovarian syndrome: Secondary | ICD-10-CM | POA: Insufficient documentation

## 2021-04-07 DIAGNOSIS — J351 Hypertrophy of tonsils: Secondary | ICD-10-CM | POA: Insufficient documentation

## 2021-04-07 DIAGNOSIS — R221 Localized swelling, mass and lump, neck: Secondary | ICD-10-CM | POA: Insufficient documentation

## 2021-04-07 LAB — HCV AB W REFLEX TO QUANT PCR: HCV Ab: <0.1 s/co ratio (ref 0.0–0.9)

## 2021-04-07 LAB — CBC WITH DIFFERENTIAL/PLATELET
Basophils Absolute: 0 10*3/uL (ref 0.0–0.2)
Basos: 1 %
EOS (ABSOLUTE): 0.2 10*3/uL (ref 0.0–0.4)
Eos: 3 %
Hematocrit: 38.5 % (ref 34.0–46.6)
Hemoglobin: 12.2 g/dL (ref 11.1–15.9)
Immature Grans (Abs): 0 10*3/uL (ref 0.0–0.1)
Immature Granulocytes: 0 %
Lymphocytes Absolute: 1.6 10*3/uL (ref 0.7–3.1)
Lymphs: 30 %
MCH: 23.8 pg — ABNORMAL LOW (ref 26.6–33.0)
MCHC: 31.7 g/dL (ref 31.5–35.7)
MCV: 75 fL — ABNORMAL LOW (ref 79–97)
Monocytes Absolute: 0.2 10*3/uL (ref 0.1–0.9)
Monocytes: 3 %
Neutrophils Absolute: 3.4 10*3/uL (ref 1.4–7.0)
Neutrophils: 63 %
Platelets: 299 10*3/uL (ref 150–450)
RBC: 5.12 x10E6/uL (ref 3.77–5.28)
RDW: 13.2 % (ref 11.7–15.4)
WBC: 5.4 10*3/uL (ref 3.4–10.8)

## 2021-04-07 LAB — LIPID PANEL
Chol/HDL Ratio: 2.9 ratio (ref 0.0–4.4)
Cholesterol, Total: 161 mg/dL (ref 100–199)
HDL: 55 mg/dL (ref >39)
LDL Chol Calc (NIH): 93 mg/dL (ref 0–99)
Triglycerides: 68 mg/dL (ref 0–149)
VLDL Cholesterol Cal: 13 mg/dL (ref 5–40)

## 2021-04-07 LAB — COMP. METABOLIC PANEL (12)
AST: 22 IU/L (ref 0–40)
Albumin/Globulin Ratio: 1.5 (ref 1.2–2.2)
Albumin: 4.1 g/dL (ref 3.8–4.8)
Alkaline Phosphatase: 71 IU/L (ref 44–121)
BUN/Creatinine Ratio: 11 (ref 9–23)
BUN: 9 mg/dL (ref 6–20)
Bilirubin Total: 0.3 mg/dL (ref 0.0–1.2)
Calcium: 9.1 mg/dL (ref 8.7–10.2)
Chloride: 107 mmol/L — ABNORMAL HIGH (ref 96–106)
Creatinine, Ser: 0.79 mg/dL (ref 0.57–1.00)
Globulin, Total: 2.7 g/dL (ref 1.5–4.5)
Glucose: 80 mg/dL (ref 65–99)
Potassium: 4.6 mmol/L (ref 3.5–5.2)
Sodium: 140 mmol/L (ref 134–144)
Total Protein: 6.8 g/dL (ref 6.0–8.5)
eGFR: 102 mL/min/{1.73_m2} (ref >59)

## 2021-04-07 LAB — HIV ANTIBODY (ROUTINE TESTING W REFLEX): HIV Screen 4th Generation wRfx: NONREACTIVE

## 2021-04-07 LAB — HCV INTERPRETATION

## 2021-04-07 LAB — TSH: TSH: 1.58 u[IU]/mL (ref 0.450–4.500)

## 2021-04-07 NOTE — Telephone Encounter (Signed)
-----   Message from Roney Jaffe, [place] sent at 04/07/2021 10:01 AM EDT ----- Please call patient and let her know that her thyroid function, kidney and liver function are within normal limits, she does require further testing to rule out any type of iron deficiency.  Her cholesterol is within normal limits, her screening for HIV and hepatitis C were negative.

## 2021-04-07 NOTE — Addendum Note (Signed)
Addended by: Roney Jaffe on: 04/07/2021 10:01 AM   Modules accepted: Orders

## 2021-04-11 ENCOUNTER — Telehealth: Payer: Self-pay | Admitting: Clinical

## 2021-04-11 NOTE — Telephone Encounter (Signed)
Pt returned call and scheduled appt for 05/10/21 at 2:30

## 2021-05-10 ENCOUNTER — Ambulatory Visit: Payer: 59 | Admitting: Family Medicine

## 2021-05-10 ENCOUNTER — Institutional Professional Consult (permissible substitution): Payer: 59 | Admitting: Clinical

## 2021-12-08 ENCOUNTER — Ambulatory Visit: Payer: Self-pay

## 2022-01-10 ENCOUNTER — Ambulatory Visit: Payer: Self-pay | Admitting: Obstetrics

## 2022-01-10 ENCOUNTER — Other Ambulatory Visit (HOSPITAL_COMMUNITY)
Admission: RE | Admit: 2022-01-10 | Discharge: 2022-01-10 | Disposition: A | Discharge status: Home / Self Care | Payer: BC Managed Care – PPO | Source: Ambulatory Visit

## 2022-01-10 ENCOUNTER — Ambulatory Visit (INDEPENDENT_AMBULATORY_CARE_PROVIDER_SITE_OTHER): Payer: BC Managed Care – PPO | Admitting: Obstetrics

## 2022-01-10 ENCOUNTER — Encounter: Payer: Self-pay | Admitting: Obstetrics

## 2022-01-10 VITALS — BP 115/74 | HR 70 | Ht 65.0 in | Wt 134.0 lb

## 2022-01-10 DIAGNOSIS — L680 Hirsutism: Secondary | ICD-10-CM | POA: Diagnosis not present

## 2022-01-10 DIAGNOSIS — N898 Other specified noninflammatory disorders of vagina: Secondary | ICD-10-CM | POA: Diagnosis present

## 2022-01-10 DIAGNOSIS — Z01419 Encounter for gynecological examination (general) (routine) without abnormal findings: Secondary | ICD-10-CM | POA: Diagnosis present

## 2022-01-10 DIAGNOSIS — Z0000 Encounter for general adult medical examination without abnormal findings: Secondary | ICD-10-CM

## 2022-01-10 DIAGNOSIS — E282 Polycystic ovarian syndrome: Secondary | ICD-10-CM

## 2022-01-10 DIAGNOSIS — F419 Anxiety disorder, unspecified: Secondary | ICD-10-CM

## 2022-01-10 DIAGNOSIS — Z113 Encounter for screening for infections with a predominantly sexual mode of transmission: Secondary | ICD-10-CM

## 2022-01-10 NOTE — Progress Notes (Signed)
Subjective:        Wendy Parsons is a 32 y.o. adult here for a routine exam.  Current complaints: Vaginal discharge and spotting with intercourse.    Personal health questionnaire:  Is patient Ashkenazi Jewish, have a family history of breast and/or ovarian cancer: yes Is there a family history of uterine cancer diagnosed at age < 72, gastrointestinal cancer, urinary tract cancer, family member who is a Personnel officer syndrome-associated carrier: no Is the patient overweight and hypertensive, family history of diabetes, personal history of gestational diabetes, preeclampsia or PCOS: no Is patient over 53, have PCOS,  family history of premature CHD under age 67, diabetes, smoke, have hypertension or peripheral artery disease:  no At any time, has a partner hit, kicked or otherwise hurt or frightened you?: no Over the past 2 weeks, have you felt down, depressed or hopeless?: no Over the past 2 weeks, have you felt little interest or pleasure in doing things?:no   Gynecologic History Patient's last menstrual period was 12/20/2021. Contraception: OCP (estrogen/progesterone) Last Pap: unknown. Results were: unknown Last mammogram: n/a. Results were: n/a  Obstetric History OB History  Gravida Para Term Preterm AB Living  1 0 0 0 1 0  SAB IAB Ectopic Multiple Live Births  0 1 0 0 0    # Outcome Date GA Lbr Len/2nd Weight Sex Delivery Anes PTL Lv  1 IAB             Past Medical History:  Diagnosis Date   Bronchitis    PCOS (polycystic ovarian syndrome)     Past Surgical History:  Procedure Laterality Date   WISDOM TOOTH EXTRACTION       Current Outpatient Medications:    drospirenone-ethinyl estradiol (YAZ) 3-0.02 MG tablet, Take 1 tablet by mouth daily., Disp: , Rfl:    norgestimate-ethinyl estradiol (ORTHO-CYCLEN,SPRINTEC,PREVIFEM) 0.25-35 MG-MCG tablet, Take 1 tablet by mouth daily., Disp: , Rfl:  Allergies  Allergen Reactions   Latex     Social History   Tobacco Use    Smoking status: Never   Smokeless tobacco: Never  Substance Use Topics   Alcohol use: Yes    Family History  Problem Relation Age of Onset   Cancer Maternal Grandfather    Heart Problems Father    Diabetes Mother    Breast cancer Other       Review of Systems  Constitutional: negative for fatigue and weight loss Respiratory: negative for cough and wheezing Cardiovascular: negative for chest pain, fatigue and palpitations Gastrointestinal: negative for abdominal pain and change in bowel habits Musculoskeletal:negative for myalgias Neurological: negative for gait problems and tremors Behavioral/Psych: negative for abusive relationship, depression Endocrine: negative for temperature intolerance    Genitourinary: positive for vaginal discharge.  negative for abnormal menstrual periods, genital lesions, hot flashes, sexual problems  Integument/breast: negative for breast lump, breast tenderness, nipple discharge and skin lesion(s)    Objective:       BP 115/74   Pulse 70   Ht 5\' 5"  (1.651 m)   Wt 134 lb (60.8 kg)   LMP 12/20/2021   BMI 22.30 kg/m  General:   Alert and no distress  Skin:   no rash or abnormalities  Lungs:   clear to auscultation bilaterally  Heart:   regular rate and rhythm, S1, S2 normal, no murmur, click, rub or gallop  Breasts:   normal without suspicious masses, skin or nipple changes or axillary nodes  Abdomen:  normal findings: no organomegaly, soft, non-tender  and no hernia  Pelvis:  External genitalia: normal general appearance Urinary system: urethral meatus normal and bladder without fullness, nontender Vaginal: normal without tenderness, induration or masses Cervix: normal appearance Adnexa: normal bimanual exam Uterus: anteverted and non-tender, normal size   Lab Review Urine pregnancy test Labs reviewed yes Radiologic studies reviewed no  I have spent a total of 20 minutes of face-to-face time, excluding clinical staff time, reviewing  notes and preparing to see patient, ordering tests and/or medications, and counseling the patient.   Assessment:    1. Encounter for gynecological examination with Papanicolaou smear of cervix Rx: - Cytology - PAP  2. PCOS (polycystic ovarian syndrome) Rx: - Ambulatory referral to Internal Medicine - US PELVIC COMPLETE WITH TRANSVAGINAL; Future  3. Hirsutism  4. Vaginal discharge Rx: - Cervicovaginal ancillary only( [place])  5. Screening for STD (sexually transmitted disease) Rx: - Hepatitis B surface antigen - Hepatitis C antibody - HIV Antibody (routine testing w rflx) - RPR  6. Anxiety Rx: - Ambulatory referral to Integrated Behavioral Health  7. Routine adult health maintenance     Plan:    Education reviewed: calcium supplements, depression evaluation, low fat, low cholesterol diet, safe sex/STD prevention, self breast exams, and weight bearing exercise. Contraception: OCP (estrogen/progesterone). Follow up in: 2 weeks.    Orders Placed This Encounter  Procedures   US PELVIC COMPLETE WITH TRANSVAGINAL    Standing Status:   Future    Standing Expiration Date:   01/11/2023    Order Specific Question:   Reason for Exam (SYMPTOM  OR DIAGNOSIS REQUIRED)    Answer:   PCOS.  Hirsutism    Order Specific Question:   Preferred imaging location?    Answer:   [facility]   Hepatitis B surface antigen   Hepatitis C antibody   HIV Antibody (routine testing w rflx)   RPR   Ambulatory referral to Integrated Behavioral Health    Referral Priority:   Routine    Referral Type:   Consultation    Referral Reason:   Specialty Services Required    Number of Visits Requested:   1   Ambulatory referral to Internal Medicine    Referral Priority:   Routine    Referral Type:   Consultation    Referral Reason:   Specialty Services Required    Requested Specialty:   Internal Medicine    Number of Visits Requested:   1      Brock Bad, MD 01/10/2022 3:01 PM

## 2022-01-10 NOTE — Progress Notes (Signed)
Referral from PCP Overdue for Pap per pt Concerned for side effects of OCP: reports palpitations, numbness Hx PCOS Reports spotting with intercourse Reports "lymph node" swelling right side neck

## 2022-01-11 LAB — CERVICOVAGINAL ANCILLARY ONLY
Bacterial Vaginitis (gardnerella): POSITIVE — AB
Candida Glabrata: NEGATIVE
Candida Vaginitis: POSITIVE — AB
Chlamydia: NEGATIVE
Comment: NEGATIVE
Comment: NEGATIVE
Comment: NEGATIVE
Comment: NEGATIVE
Comment: NEGATIVE
Comment: NORMAL
Neisseria Gonorrhea: NEGATIVE
Trichomonas: NEGATIVE

## 2022-01-12 ENCOUNTER — Other Ambulatory Visit: Payer: Self-pay | Admitting: Obstetrics

## 2022-01-12 ENCOUNTER — Other Ambulatory Visit: Payer: Self-pay | Admitting: *Deleted

## 2022-01-12 DIAGNOSIS — B9689 Other specified bacterial agents as the cause of diseases classified elsewhere: Secondary | ICD-10-CM

## 2022-01-12 DIAGNOSIS — B3731 Acute candidiasis of vulva and vagina: Secondary | ICD-10-CM

## 2022-01-12 LAB — CYTOLOGY - PAP
Comment: NEGATIVE
Diagnosis: NEGATIVE
High risk HPV: NEGATIVE

## 2022-01-12 MED ORDER — FLUCONAZOLE 150 MG PO TABS: 150.0000 mg | ORAL_TABLET | Freq: Once | ORAL | 0 refills | Status: AC

## 2022-01-12 MED ORDER — METRONIDAZOLE 500 MG PO TABS: 500.0000 mg | ORAL_TABLET | Freq: Two times a day (BID) | ORAL | 0 refills | Status: AC

## 2022-01-12 NOTE — Progress Notes (Unsigned)
Flagyl and Diflucan sent for +BV/Yeast. See lab results

## 2022-01-19 ENCOUNTER — Ambulatory Visit: Payer: Self-pay | Admitting: Advanced Practice Midwife

## 2022-01-20 ENCOUNTER — Other Ambulatory Visit: Payer: BC Managed Care – PPO

## 2022-01-23 ENCOUNTER — Ambulatory Visit
Admission: RE | Admit: 2022-01-23 | Discharge: 2022-01-23 | Disposition: A | Discharge status: Home / Self Care | Payer: BC Managed Care – PPO | Source: Ambulatory Visit | Attending: Obstetrics | Admitting: Obstetrics

## 2022-01-23 DIAGNOSIS — E282 Polycystic ovarian syndrome: Secondary | ICD-10-CM | POA: Diagnosis present

## 2022-01-25 ENCOUNTER — Other Ambulatory Visit: Payer: BC Managed Care – PPO

## 2022-01-26 ENCOUNTER — Ambulatory Visit (INDEPENDENT_AMBULATORY_CARE_PROVIDER_SITE_OTHER): Payer: BC Managed Care – PPO | Admitting: Obstetrics

## 2022-01-26 ENCOUNTER — Encounter: Payer: Self-pay | Admitting: Obstetrics

## 2022-01-26 DIAGNOSIS — L680 Hirsutism: Secondary | ICD-10-CM | POA: Diagnosis not present

## 2022-01-26 DIAGNOSIS — E282 Polycystic ovarian syndrome: Secondary | ICD-10-CM | POA: Diagnosis not present

## 2022-01-26 NOTE — Progress Notes (Signed)
TELEHEALTH GYNECOLOGY VISIT ENCOUNTER NOTE  Provider location: [facility]   Patient location: Home  I connected with Wendy Parsons on 01/26/22 at 11:15 AM EDT by telephone and verified that I am speaking with the correct person using two identifiers. Patient was unable to do MyChart audiovisual encounter due to technical difficulties, she tried several times.    I discussed the limitations, risks, security and privacy concerns of performing an evaluation and management service by telephone and the availability of in person appointments. I also discussed with the patient that there may be a patient responsible charge related to this service. The patient expressed understanding and agreed to proceed.   History:  Wendy Parsons is a 31 y.o. [id number] female being evaluated today for ultrasound results.  She has a history of PCOS and hirsutism.  She denies any abnormal vaginal discharge, bleeding, pelvic pain or other concerns.       Past Medical History:  Diagnosis Date   Bronchitis    PCOS (polycystic ovarian syndrome)    Past Surgical History:  Procedure Laterality Date   WISDOM TOOTH EXTRACTION     The following portions of the patient's history were reviewed and updated as appropriate: allergies, current medications, past family history, past medical history, past social history, past surgical history and problem list.   Health Maintenance:  Normal pap and negative HRHPV on 01-10-2022.   Review of Systems:  Pertinent items noted in HPI and remainder of comprehensive ROS otherwise negative.  Physical Exam:   General:  Alert, oriented and cooperative.   Mental Status: Normal mood and affect perceived. Normal judgment and thought content.  Physical exam deferred due to nature of the encounter  Labs and Imaging No results found for this or any previous visit (from the past 336 hour(s)). US PELVIC COMPLETE WITH TRANSVAGINAL  Result Date:  01/23/2022 CLINICAL DATA:  Pelvic pain, uterine fibroids EXAM: TRANSABDOMINAL AND TRANSVAGINAL ULTRASOUND OF PELVIS TECHNIQUE: Both transabdominal and transvaginal ultrasound examinations of the pelvis were performed. Transabdominal technique was performed for global imaging of the pelvis including uterus, ovaries, adnexal regions, and pelvic cul-de-sac. It was necessary to proceed with endovaginal exam following the transabdominal exam to visualize the endometrium and ovaries. COMPARISON:  None Available. FINDINGS: Uterus Measurements: 5.5 x 5.1 x 6.1 cm = volume: 90.8 mL. There is inhomogeneous echogenicity in myometrium with few fibroids in the fundus and body. Largest of the fibroids measures 3.3 x 3.1 x 3 cm. Uterus is retroflexed. Endometrium Thickness: Endometrial stripe measures 6 mm. There is fluid in the endometrial cavity measuring up to 6 mm in diameter. There is no abnormal increased vascularity in the endometrium. Right ovary Measurements: 2.8 x 1.7 x 2.5 cm = volume: 6.1 mL. There are few subcentimeter follicles. Imaging findings are not typical for polycystic ovary syndrome. Left ovary Measurements: 2.9 x 2.2 x 2.6 cm = volume: 8.5 mL. There are few subcentimeter follicles. Imaging findings are not typical for polycystic ovary syndrome. Other findings Small amount of free fluid is seen. IMPRESSION: There is inhomogeneous echogenicity in the myometrium with few fibroids. Small amount of fluid is seen in the endometrial cavity, possibly blood. There is no significant thickening of endometrium or any significant increase in vascularity in the endometrium. There are few subcentimeter follicles in both ovaries. Imaging findings are not typical for polycystic ovary syndrome. Small amount of free fluid in the pelvis may suggest recent rupture of ovarian cyst or follicle. Electronically Signed  By: Ernie Avena M.D.   On: 01/23/2022 16:04        Assessment and Plan:     1. PCOS (polycystic  ovarian syndrome) - Metformin and OCP's trecommended  2. Hirsutism - same as above    I discussed the assessment and treatment plan with the patient. The patient was provided an opportunity to ask questions and all were answered. The patient agreed with the plan and demonstrated an understanding of the instructions.   The patient was advised to call back or seek an in-person evaluation/go to the ED if the symptoms worsen or if the condition fails to improve as anticipated.  I have spent a total of 10 minutes of non-face-to-face time, excluding clinical staff time, reviewing notes and preparing to see patient, ordering tests and/or medications, and counseling the patient.    Coral Ceo, MD [facility], [facility], [facility] 01/26/22

## 2022-02-21 ENCOUNTER — Telehealth: Payer: BC Managed Care – PPO | Admitting: Obstetrics

## 2022-09-14 DIAGNOSIS — R69 Illness, unspecified: Secondary | ICD-10-CM | POA: Diagnosis not present

## 2022-09-20 DIAGNOSIS — R69 Illness, unspecified: Secondary | ICD-10-CM | POA: Diagnosis not present

## 2022-09-28 DIAGNOSIS — R69 Illness, unspecified: Secondary | ICD-10-CM | POA: Diagnosis not present

## 2022-10-04 DIAGNOSIS — F4320 Adjustment disorder, unspecified: Secondary | ICD-10-CM | POA: Diagnosis not present

## 2022-10-12 DIAGNOSIS — F4320 Adjustment disorder, unspecified: Secondary | ICD-10-CM | POA: Diagnosis not present

## 2022-10-25 DIAGNOSIS — F4320 Adjustment disorder, unspecified: Secondary | ICD-10-CM | POA: Diagnosis not present

## 2022-11-01 DIAGNOSIS — F4320 Adjustment disorder, unspecified: Secondary | ICD-10-CM | POA: Diagnosis not present

## 2022-11-08 DIAGNOSIS — F4320 Adjustment disorder, unspecified: Secondary | ICD-10-CM | POA: Diagnosis not present

## 2022-11-15 DIAGNOSIS — F4320 Adjustment disorder, unspecified: Secondary | ICD-10-CM | POA: Diagnosis not present

## 2022-11-22 DIAGNOSIS — F4320 Adjustment disorder, unspecified: Secondary | ICD-10-CM | POA: Diagnosis not present

## 2022-11-30 DIAGNOSIS — F4320 Adjustment disorder, unspecified: Secondary | ICD-10-CM | POA: Diagnosis not present

## 2022-12-06 DIAGNOSIS — F4320 Adjustment disorder, unspecified: Secondary | ICD-10-CM | POA: Diagnosis not present

## 2022-12-12 DIAGNOSIS — F4320 Adjustment disorder, unspecified: Secondary | ICD-10-CM | POA: Diagnosis not present

## 2023-02-22 ENCOUNTER — Ambulatory Visit: Payer: Self-pay | Admitting: Emergency Medicine

## 2024-03-19 IMAGING — US US PELVIS COMPLETE WITH TRANSVAGINAL
1 series · 15 of 25 positions shown · non-contrast
Comparison: None Available.

CLINICAL DATA: Pelvic pain, uterine fibroids

EXAM:
TRANSABDOMINAL AND TRANSVAGINAL ULTRASOUND OF PELVIS
TECHNIQUE: Both transabdominal and transvaginal ultrasound examinations of the
pelvis were performed. Transabdominal technique was performed for
global imaging of the pelvis including uterus, ovaries, adnexal
regions, and pelvic cul-de-sac. It was necessary to proceed with
endovaginal exam following the transabdominal exam to visualize the
endometrium and ovaries.

[Series 1: us pelvis complete · 15 of 96 slices shown]
[im 1/96]
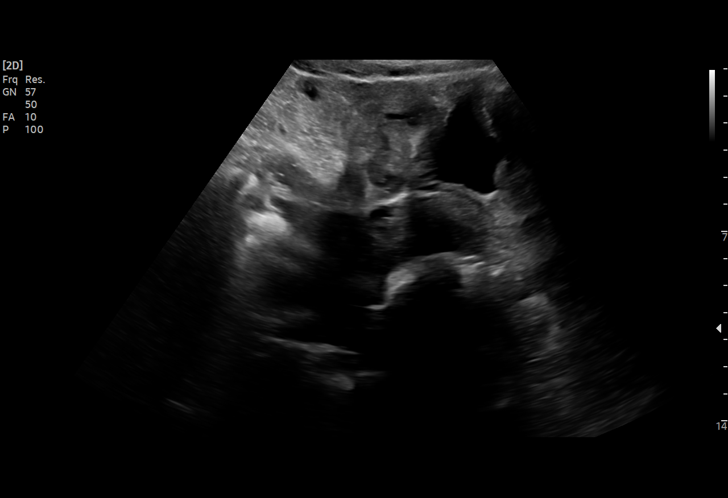
[im 8/96]
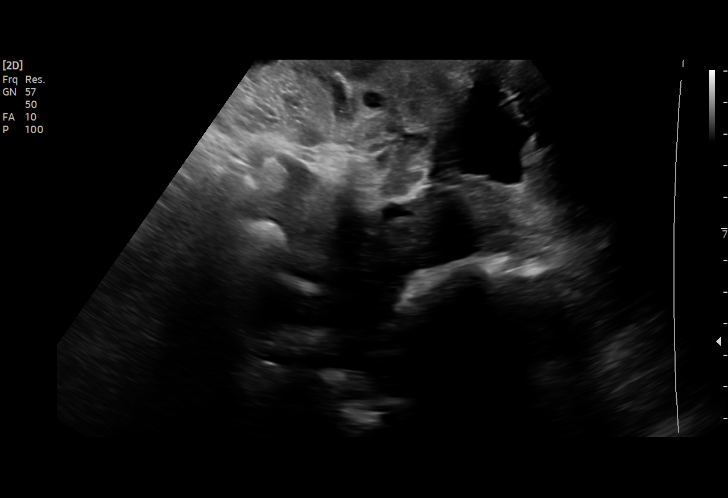
[im 16/96]
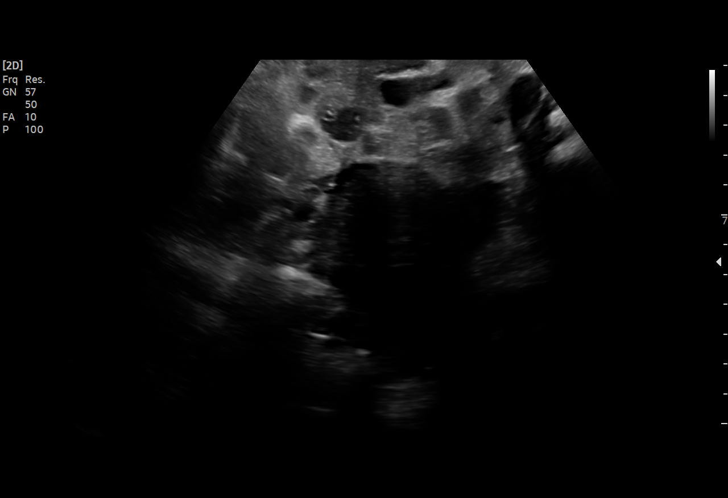
[im 20/96]
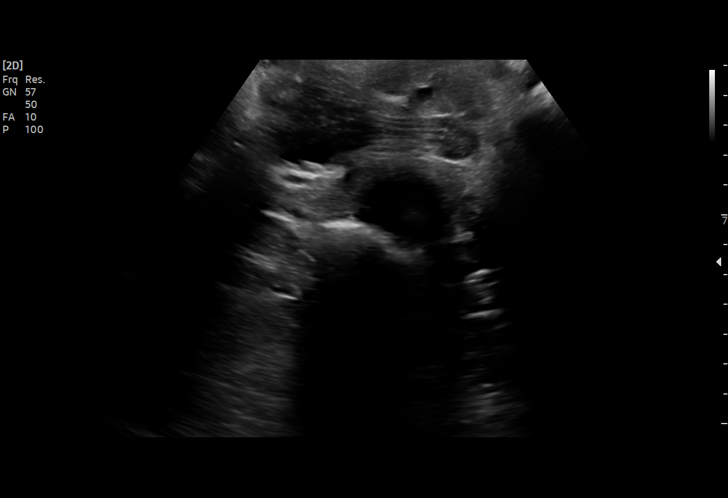
[im 28/96]
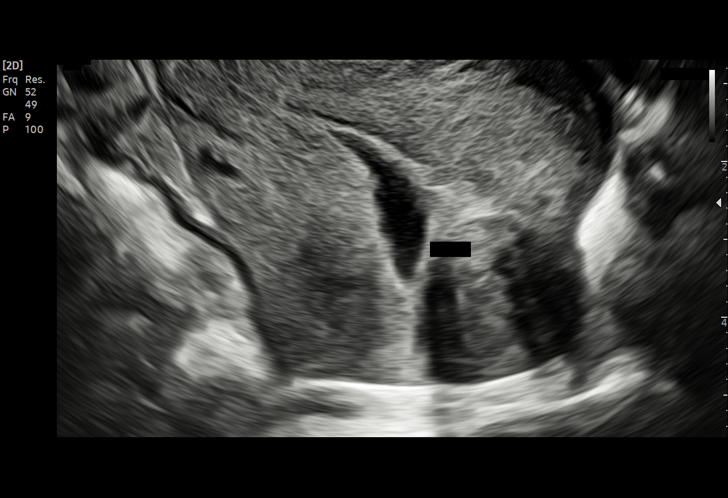
[im 36/96]
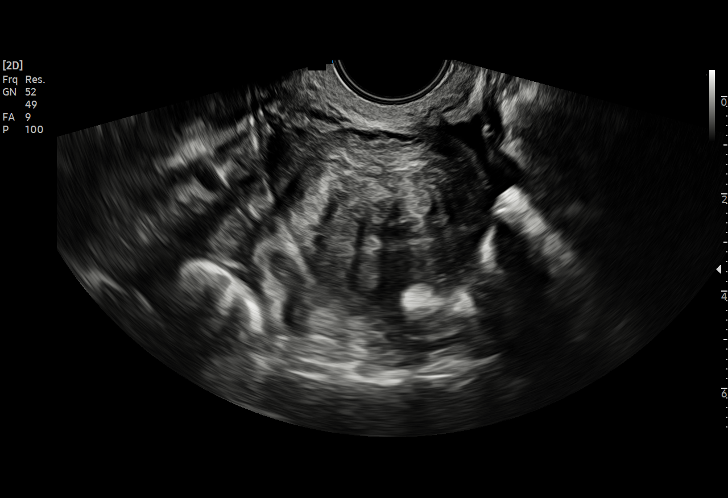
[im 40/96]
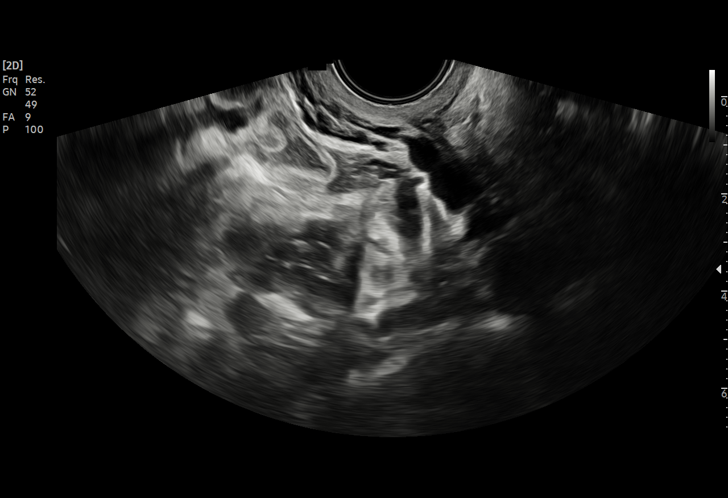
[im 48/96]
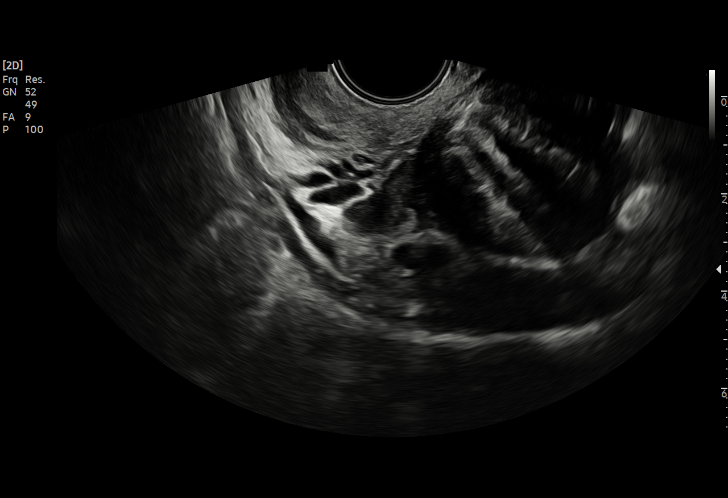
[im 56/96]
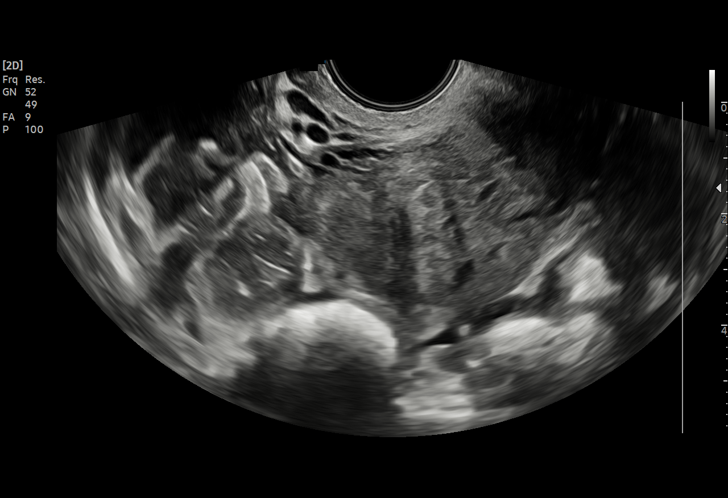
[im 60/96]
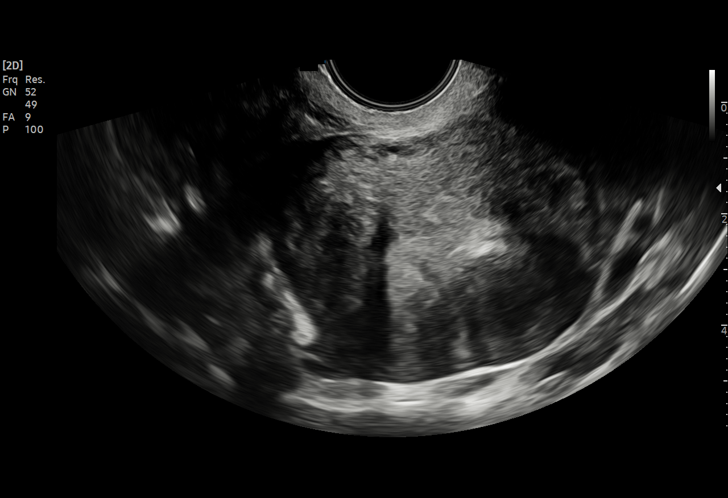
[im 68/96]
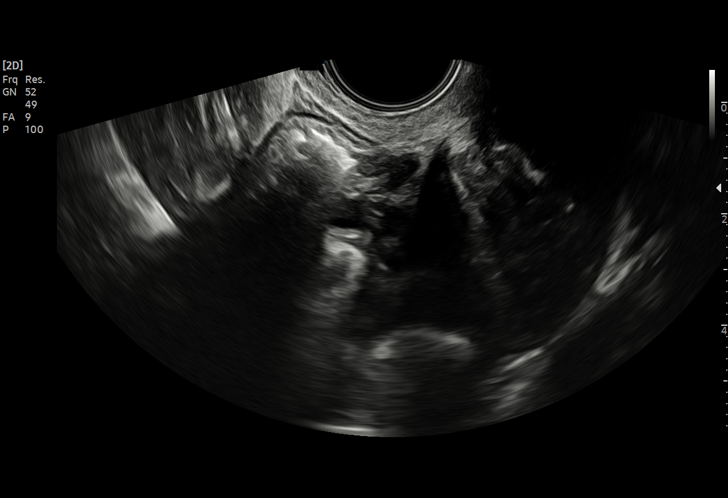
[im 76/96]
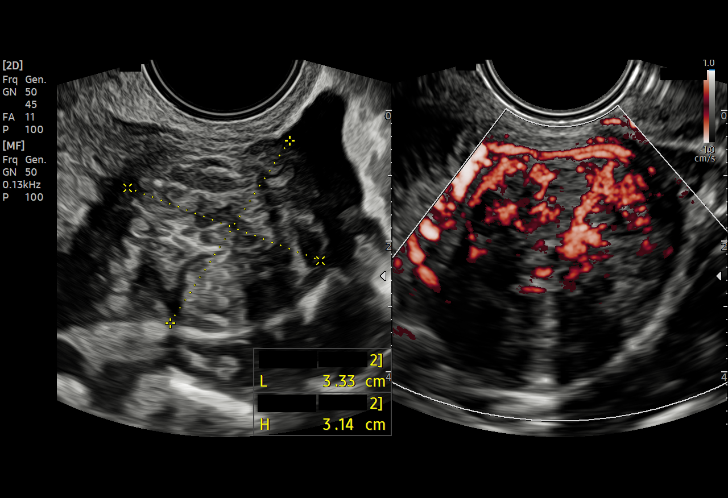
[im 80/96]
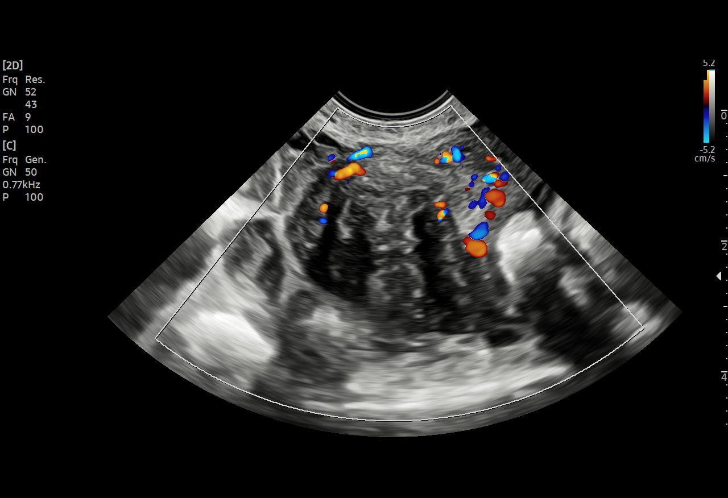
[im 88/96]
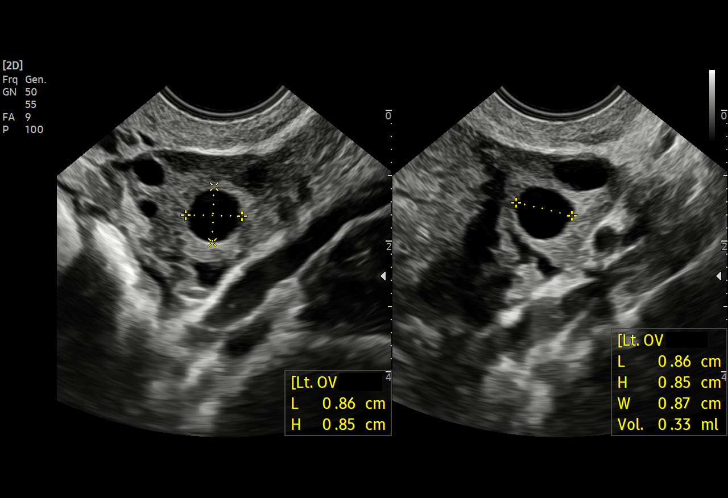
[im 96/96]
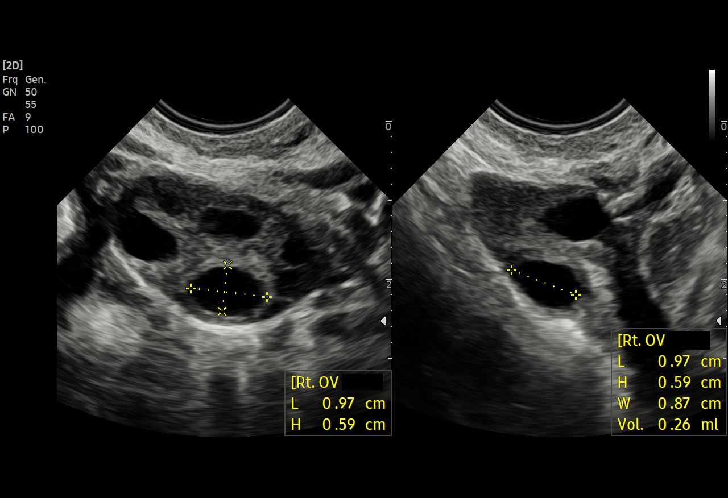

[15 of 25 positions shown; findings below may reference images not displayed]

FINDINGS: Uterus

Measurements: 5.5 x 5.1 x 6.1 cm = volume: 90.8 mL. There is
inhomogeneous echogenicity in myometrium with few fibroids in the
fundus and body. Largest of the fibroids measures 3.3 x 3.1 x 3 cm.
Uterus is retroflexed.

Endometrium

Thickness: Endometrial stripe measures 6 mm. There is fluid in the
endometrial cavity measuring up to 6 mm in diameter. There is no
abnormal increased vascularity in the endometrium.

Right ovary

Measurements: 2.8 x 1.7 x 2.5 cm = volume: 6.1 mL. There are few
subcentimeter follicles. Imaging findings are not typical for
polycystic ovary syndrome.

Left ovary

Measurements: 2.9 x 2.2 x 2.6 cm = volume: 8.5 mL. There are few
subcentimeter follicles. Imaging findings are not typical for
polycystic ovary syndrome.

Other findings

Small amount of free fluid is seen.
IMPRESSION: There is inhomogeneous echogenicity in the myometrium with few
fibroids. Small amount of fluid is seen in the endometrial cavity,
possibly blood. There is no significant thickening of endometrium or
any significant increase in vascularity in the endometrium.

There are few subcentimeter follicles in both ovaries. Imaging
findings are not typical for polycystic ovary syndrome.

Small amount of free fluid in the pelvis may suggest recent rupture
of ovarian cyst or follicle.
